# Patient Record
Sex: Female | Born: 1945 | Race: White | Hispanic: No
Health system: Southern US, Community
[De-identification: ages and names within clinical notes are randomized; demographics above are authoritative.]

## PROBLEM LIST (undated history)

## (undated) DIAGNOSIS — J939 Pneumothorax, unspecified: Secondary | ICD-10-CM

## (undated) DIAGNOSIS — R6520 Severe sepsis without septic shock: Secondary | ICD-10-CM

## (undated) DIAGNOSIS — C3431 Malignant neoplasm of lower lobe, right bronchus or lung: Secondary | ICD-10-CM

## (undated) DIAGNOSIS — J181 Lobar pneumonia, unspecified organism: Secondary | ICD-10-CM

## (undated) DIAGNOSIS — J9621 Acute and chronic respiratory failure with hypoxia: Secondary | ICD-10-CM

## (undated) DIAGNOSIS — A419 Sepsis, unspecified organism: Secondary | ICD-10-CM

---

## 2019-04-06 ENCOUNTER — Inpatient Hospital Stay
Admission: RE | Admit: 2019-04-06 | Discharge: 2019-05-25 | Disposition: E | Payer: Medicare Other | Source: Ambulatory Visit | Attending: Internal Medicine | Admitting: Internal Medicine

## 2019-04-06 ENCOUNTER — Other Ambulatory Visit (HOSPITAL_COMMUNITY): Payer: Medicare Other

## 2019-04-06 DIAGNOSIS — R0603 Acute respiratory distress: Secondary | ICD-10-CM

## 2019-04-06 DIAGNOSIS — L985 Mucinosis of the skin: Secondary | ICD-10-CM

## 2019-04-06 DIAGNOSIS — Z9289 Personal history of other medical treatment: Secondary | ICD-10-CM

## 2019-04-06 DIAGNOSIS — J939 Pneumothorax, unspecified: Secondary | ICD-10-CM | POA: Diagnosis present

## 2019-04-06 DIAGNOSIS — J9621 Acute and chronic respiratory failure with hypoxia: Secondary | ICD-10-CM | POA: Diagnosis present

## 2019-04-06 DIAGNOSIS — C3431 Malignant neoplasm of lower lobe, right bronchus or lung: Secondary | ICD-10-CM | POA: Diagnosis present

## 2019-04-06 DIAGNOSIS — A419 Sepsis, unspecified organism: Secondary | ICD-10-CM | POA: Diagnosis present

## 2019-04-06 DIAGNOSIS — R509 Fever, unspecified: Secondary | ICD-10-CM

## 2019-04-06 DIAGNOSIS — J111 Influenza due to unidentified influenza virus with other respiratory manifestations: Secondary | ICD-10-CM

## 2019-04-06 DIAGNOSIS — J90 Pleural effusion, not elsewhere classified: Secondary | ICD-10-CM

## 2019-04-06 DIAGNOSIS — J9690 Respiratory failure, unspecified, unspecified whether with hypoxia or hypercapnia: Secondary | ICD-10-CM

## 2019-04-06 DIAGNOSIS — J189 Pneumonia, unspecified organism: Secondary | ICD-10-CM

## 2019-04-06 DIAGNOSIS — J181 Lobar pneumonia, unspecified organism: Secondary | ICD-10-CM | POA: Diagnosis present

## 2019-04-06 DIAGNOSIS — R6520 Severe sepsis without septic shock: Secondary | ICD-10-CM | POA: Diagnosis present

## 2019-04-06 DIAGNOSIS — J811 Chronic pulmonary edema: Secondary | ICD-10-CM

## 2019-04-06 HISTORY — DX: Malignant neoplasm of lower lobe, right bronchus or lung: C34.31

## 2019-04-06 HISTORY — DX: Lobar pneumonia, unspecified organism: J18.1

## 2019-04-06 HISTORY — DX: Pneumothorax, unspecified: J93.9

## 2019-04-06 HISTORY — DX: Severe sepsis without septic shock: R65.20

## 2019-04-06 HISTORY — DX: Acute and chronic respiratory failure with hypoxia: J96.21

## 2019-04-06 HISTORY — DX: Sepsis, unspecified organism: A41.9

## 2019-04-06 LAB — T4, FREE: Free T4: 1.03 ng/dL (ref 0.82–1.77)

## 2019-04-06 LAB — VANCOMYCIN, TROUGH: Vancomycin Tr: 23 ug/mL (ref 15–20)

## 2019-04-07 LAB — HEMOGLOBIN A1C
Hgb A1c MFr Bld: 5.6 % (ref 4.8–5.6)
Mean Plasma Glucose: 114.02 mg/dL

## 2019-04-07 LAB — COMPREHENSIVE METABOLIC PANEL
ALT: 41 U/L (ref 0–44)
AST: 55 U/L — ABNORMAL HIGH (ref 15–41)
Albumin: 1.8 g/dL — ABNORMAL LOW (ref 3.5–5.0)
Alkaline Phosphatase: 140 U/L — ABNORMAL HIGH (ref 38–126)
Anion gap: 8 (ref 5–15)
BUN: 16 mg/dL (ref 8–23)
CO2: 27 mmol/L (ref 22–32)
Calcium: 8.7 mg/dL — ABNORMAL LOW (ref 8.9–10.3)
Chloride: 101 mmol/L (ref 98–111)
Creatinine, Ser: 0.53 mg/dL (ref 0.44–1.00)
GFR calc Af Amer: >60 mL/min (ref >60)
GFR calc non Af Amer: >60 mL/min (ref >60)
Glucose, Bld: 112 mg/dL — ABNORMAL HIGH (ref 70–99)
Potassium: 4.2 mmol/L (ref 3.5–5.1)
Sodium: 136 mmol/L (ref 135–145)
Total Bilirubin: 0.7 mg/dL (ref 0.3–1.2)
Total Protein: 5.7 g/dL — ABNORMAL LOW (ref 6.5–8.1)

## 2019-04-07 LAB — CBC WITH DIFFERENTIAL/PLATELET
Abs Immature Granulocytes: 0.25 10*3/uL — ABNORMAL HIGH (ref 0.00–0.07)
Basophils Absolute: 0.1 10*3/uL (ref 0.0–0.1)
Basophils Relative: 1 %
Eosinophils Absolute: 0.4 10*3/uL (ref 0.0–0.5)
Eosinophils Relative: 3 %
HCT: 28.9 % — ABNORMAL LOW (ref 36.0–46.0)
Hemoglobin: 9.1 g/dL — ABNORMAL LOW (ref 12.0–15.0)
Immature Granulocytes: 2 %
Lymphocytes Relative: 12 %
Lymphs Abs: 1.7 10*3/uL (ref 0.7–4.0)
MCH: 29 pg (ref 26.0–34.0)
MCHC: 31.5 g/dL (ref 30.0–36.0)
MCV: 92 fL (ref 80.0–100.0)
Monocytes Absolute: 1 10*3/uL (ref 0.1–1.0)
Monocytes Relative: 7 %
Neutro Abs: 10.6 10*3/uL — ABNORMAL HIGH (ref 1.7–7.7)
Neutrophils Relative %: 75 %
Platelets: 486 10*3/uL — ABNORMAL HIGH (ref 150–400)
RBC: 3.14 MIL/uL — ABNORMAL LOW (ref 3.87–5.11)
RDW: 15.7 % — ABNORMAL HIGH (ref 11.5–15.5)
WBC: 14 10*3/uL — ABNORMAL HIGH (ref 4.0–10.5)
nRBC: 0.1 % (ref 0.0–0.2)

## 2019-04-07 LAB — PROTIME-INR
INR: 1 (ref 0.8–1.2)
Prothrombin Time: 13.4 seconds (ref 11.4–15.2)

## 2019-04-07 LAB — MAGNESIUM: Magnesium: 2 mg/dL (ref 1.7–2.4)

## 2019-04-07 LAB — TSH: TSH: 1.995 u[IU]/mL (ref 0.350–4.500)

## 2019-04-07 LAB — PHOSPHORUS: Phosphorus: 2.9 mg/dL (ref 2.5–4.6)

## 2019-04-08 LAB — BASIC METABOLIC PANEL
Anion gap: 9 (ref 5–15)
BUN: 18 mg/dL (ref 8–23)
CO2: 26 mmol/L (ref 22–32)
Calcium: 8.4 mg/dL — ABNORMAL LOW (ref 8.9–10.3)
Chloride: 101 mmol/L (ref 98–111)
Creatinine, Ser: 0.7 mg/dL (ref 0.44–1.00)
GFR calc Af Amer: >60 mL/min (ref >60)
GFR calc non Af Amer: >60 mL/min (ref >60)
Glucose, Bld: 106 mg/dL — ABNORMAL HIGH (ref 70–99)
Potassium: 4.3 mmol/L (ref 3.5–5.1)
Sodium: 136 mmol/L (ref 135–145)

## 2019-04-08 LAB — CBC
HCT: 29 % — ABNORMAL LOW (ref 36.0–46.0)
Hemoglobin: 9.1 g/dL — ABNORMAL LOW (ref 12.0–15.0)
MCH: 29 pg (ref 26.0–34.0)
MCHC: 31.4 g/dL (ref 30.0–36.0)
MCV: 92.4 fL (ref 80.0–100.0)
Platelets: 443 10*3/uL — ABNORMAL HIGH (ref 150–400)
RBC: 3.14 MIL/uL — ABNORMAL LOW (ref 3.87–5.11)
RDW: 15.7 % — ABNORMAL HIGH (ref 11.5–15.5)
WBC: 14.5 10*3/uL — ABNORMAL HIGH (ref 4.0–10.5)
nRBC: 0 % (ref 0.0–0.2)

## 2019-04-08 LAB — MAGNESIUM: Magnesium: 2.1 mg/dL (ref 1.7–2.4)

## 2019-04-08 LAB — PHOSPHORUS: Phosphorus: 3.3 mg/dL (ref 2.5–4.6)

## 2019-04-09 LAB — URINALYSIS, ROUTINE W REFLEX MICROSCOPIC
Bilirubin Urine: NEGATIVE
Glucose, UA: NEGATIVE mg/dL
Ketones, ur: NEGATIVE mg/dL
Leukocytes,Ua: NEGATIVE
Nitrite: NEGATIVE
Protein, ur: 30 mg/dL — AB
RBC / HPF: >50 RBC/hpf — ABNORMAL HIGH (ref 0–5)
Specific Gravity, Urine: 1.019 (ref 1.005–1.030)
pH: 5 (ref 5.0–8.0)

## 2019-04-09 LAB — C DIFFICILE QUICK SCREEN W PCR REFLEX
C Diff antigen: NEGATIVE
C Diff interpretation: NOT DETECTED
C Diff toxin: NEGATIVE

## 2019-04-10 ENCOUNTER — Other Ambulatory Visit (HOSPITAL_COMMUNITY): Payer: Medicare Other

## 2019-04-10 LAB — URINE CULTURE: Culture: NO GROWTH

## 2019-04-10 LAB — BLOOD GAS, ARTERIAL
Acid-Base Excess: 6.8 mmol/L — ABNORMAL HIGH (ref 0.0–2.0)
Bicarbonate: 31.8 mmol/L — ABNORMAL HIGH (ref 20.0–28.0)
Delivery systems: POSITIVE
Expiratory PAP: 10
FIO2: 60
Inspiratory PAP: 18
Mode: POSITIVE
O2 Saturation: 95.6 %
Patient temperature: 98.6
pCO2 arterial: 54.9 mmHg — ABNORMAL HIGH (ref 32.0–48.0)
pH, Arterial: 7.381 (ref 7.350–7.450)
pO2, Arterial: 78.2 mmHg — ABNORMAL LOW (ref 83.0–108.0)

## 2019-04-11 ENCOUNTER — Other Ambulatory Visit (HOSPITAL_COMMUNITY): Payer: Medicare Other

## 2019-04-11 LAB — BASIC METABOLIC PANEL
Anion gap: 8 (ref 5–15)
BUN: 20 mg/dL (ref 8–23)
CO2: 34 mmol/L — ABNORMAL HIGH (ref 22–32)
Calcium: 9 mg/dL (ref 8.9–10.3)
Chloride: 95 mmol/L — ABNORMAL LOW (ref 98–111)
Creatinine, Ser: 0.48 mg/dL (ref 0.44–1.00)
GFR calc Af Amer: >60 mL/min (ref >60)
GFR calc non Af Amer: >60 mL/min (ref >60)
Glucose, Bld: 103 mg/dL — ABNORMAL HIGH (ref 70–99)
Potassium: 4.1 mmol/L (ref 3.5–5.1)
Sodium: 137 mmol/L (ref 135–145)

## 2019-04-11 LAB — BLOOD GAS, ARTERIAL
Acid-Base Excess: 11.6 mmol/L — ABNORMAL HIGH (ref 0.0–2.0)
Bicarbonate: 36.5 mmol/L — ABNORMAL HIGH (ref 20.0–28.0)
Delivery systems: POSITIVE
Expiratory PAP: 5
FIO2: 0.45
Inspiratory PAP: 18
O2 Saturation: 98.7 %
Patient temperature: 98.6
pCO2 arterial: 56.2 mmHg — ABNORMAL HIGH (ref 32.0–48.0)
pH, Arterial: 7.428 (ref 7.350–7.450)
pO2, Arterial: 127 mmHg — ABNORMAL HIGH (ref 83.0–108.0)

## 2019-04-11 LAB — CBC
HCT: 28.1 % — ABNORMAL LOW (ref 36.0–46.0)
Hemoglobin: 8.7 g/dL — ABNORMAL LOW (ref 12.0–15.0)
MCH: 29.3 pg (ref 26.0–34.0)
MCHC: 31 g/dL (ref 30.0–36.0)
MCV: 94.6 fL (ref 80.0–100.0)
Platelets: 373 10*3/uL (ref 150–400)
RBC: 2.97 MIL/uL — ABNORMAL LOW (ref 3.87–5.11)
RDW: 16 % — ABNORMAL HIGH (ref 11.5–15.5)
WBC: 14.9 10*3/uL — ABNORMAL HIGH (ref 4.0–10.5)
nRBC: 0 % (ref 0.0–0.2)

## 2019-04-11 LAB — MAGNESIUM: Magnesium: 2.1 mg/dL (ref 1.7–2.4)

## 2019-04-11 LAB — PHOSPHORUS: Phosphorus: 3.7 mg/dL (ref 2.5–4.6)

## 2019-04-14 ENCOUNTER — Other Ambulatory Visit (HOSPITAL_COMMUNITY): Payer: Medicare Other

## 2019-04-14 DIAGNOSIS — J9621 Acute and chronic respiratory failure with hypoxia: Secondary | ICD-10-CM

## 2019-04-14 DIAGNOSIS — A419 Sepsis, unspecified organism: Secondary | ICD-10-CM

## 2019-04-14 DIAGNOSIS — C3431 Malignant neoplasm of lower lobe, right bronchus or lung: Secondary | ICD-10-CM

## 2019-04-14 DIAGNOSIS — J939 Pneumothorax, unspecified: Secondary | ICD-10-CM | POA: Diagnosis not present

## 2019-04-14 DIAGNOSIS — J181 Lobar pneumonia, unspecified organism: Secondary | ICD-10-CM | POA: Diagnosis not present

## 2019-04-14 DIAGNOSIS — R6520 Severe sepsis without septic shock: Secondary | ICD-10-CM

## 2019-04-14 LAB — BASIC METABOLIC PANEL
Anion gap: 11 (ref 5–15)
BUN: 28 mg/dL — ABNORMAL HIGH (ref 8–23)
CO2: 33 mmol/L — ABNORMAL HIGH (ref 22–32)
Calcium: 9.2 mg/dL (ref 8.9–10.3)
Chloride: 94 mmol/L — ABNORMAL LOW (ref 98–111)
Creatinine, Ser: 0.41 mg/dL — ABNORMAL LOW (ref 0.44–1.00)
GFR calc Af Amer: >60 mL/min (ref >60)
GFR calc non Af Amer: >60 mL/min (ref >60)
Glucose, Bld: 127 mg/dL — ABNORMAL HIGH (ref 70–99)
Potassium: 5.1 mmol/L (ref 3.5–5.1)
Sodium: 138 mmol/L (ref 135–145)

## 2019-04-14 LAB — CBC
HCT: 28 % — ABNORMAL LOW (ref 36.0–46.0)
Hemoglobin: 8.6 g/dL — ABNORMAL LOW (ref 12.0–15.0)
MCH: 28.9 pg (ref 26.0–34.0)
MCHC: 30.7 g/dL (ref 30.0–36.0)
MCV: 94 fL (ref 80.0–100.0)
Platelets: 411 10*3/uL — ABNORMAL HIGH (ref 150–400)
RBC: 2.98 MIL/uL — ABNORMAL LOW (ref 3.87–5.11)
RDW: 15.7 % — ABNORMAL HIGH (ref 11.5–15.5)
WBC: 11.5 10*3/uL — ABNORMAL HIGH (ref 4.0–10.5)
nRBC: 0 % (ref 0.0–0.2)

## 2019-04-14 MED ORDER — FLUCONAZOLE IN SODIUM CHLORIDE 200-0.9 MG/100ML-% IV SOLN
200.00 | INTRAVENOUS | Status: DC
Start: 2019-04-06 — End: 2019-04-14

## 2019-04-14 MED ORDER — PSYLLIUM 51.7 % PO PACK
1.00 | PACK | ORAL | Status: DC
Start: 2019-04-07 — End: 2019-04-14

## 2019-04-14 MED ORDER — INSULIN LISPRO 100 UNIT/ML ~~LOC~~ SOLN
0.00 | SUBCUTANEOUS | Status: DC
Start: 2019-04-06 — End: 2019-04-14

## 2019-04-14 MED ORDER — IOHEXOL 350 MG/ML SOLN
100.0000 mL | Freq: Once | INTRAVENOUS | Status: AC | PRN
  Administered 2019-04-14: 100 mL via INTRAVENOUS

## 2019-04-14 MED ORDER — SODIUM BICARBONATE 650 MG PO TABS
650.00 | ORAL_TABLET | ORAL | Status: DC
Start: ? — End: 2019-04-14

## 2019-04-14 MED ORDER — PANTOPRAZOLE SODIUM 40 MG IV SOLR
40.00 | INTRAVENOUS | Status: DC
Start: 2019-04-06 — End: 2019-04-14

## 2019-04-14 MED ORDER — ACETAMINOPHEN 500 MG PO TABS
1000.00 | ORAL_TABLET | ORAL | Status: DC
Start: 2019-04-06 — End: 2019-04-14

## 2019-04-14 MED ORDER — GENERIC EXTERNAL MEDICATION
1.00 | Status: DC
Start: 2019-04-07 — End: 2019-04-14

## 2019-04-14 MED ORDER — GENERIC EXTERNAL MEDICATION
Status: DC
Start: 2019-04-07 — End: 2019-04-14

## 2019-04-14 MED ORDER — MIRTAZAPINE 15 MG PO TABS
15.00 | ORAL_TABLET | ORAL | Status: DC
Start: ? — End: 2019-04-14

## 2019-04-14 MED ORDER — FLUOXETINE HCL 20 MG PO CAPS
40.00 | ORAL_CAPSULE | ORAL | Status: DC
Start: 2019-04-07 — End: 2019-04-14

## 2019-04-14 MED ORDER — LABETALOL HCL 5 MG/ML IV SOLN
10.00 | INTRAVENOUS | Status: DC
Start: ? — End: 2019-04-14

## 2019-04-14 MED ORDER — HEPARIN SODIUM (PORCINE) 5000 UNIT/ML IJ SOLN
5000.00 | INTRAMUSCULAR | Status: DC
Start: 2019-04-06 — End: 2019-04-14

## 2019-04-14 MED ORDER — ONDANSETRON HCL 4 MG/2ML IJ SOLN
4.00 | INTRAMUSCULAR | Status: DC
Start: ? — End: 2019-04-14

## 2019-04-14 MED ORDER — GENERIC EXTERNAL MEDICATION
5.00 | Status: DC
Start: ? — End: 2019-04-14

## 2019-04-14 MED ORDER — GENERIC EXTERNAL MEDICATION
Status: DC
Start: ? — End: 2019-04-14

## 2019-04-14 MED ORDER — DEXTROSE 10 % IV SOLN
125.00 | INTRAVENOUS | Status: DC
Start: ? — End: 2019-04-14

## 2019-04-14 MED ORDER — BISACODYL 10 MG RE SUPP
10.00 | RECTAL | Status: DC
Start: ? — End: 2019-04-14

## 2019-04-14 MED ORDER — GLUCAGON HCL RDNA (DIAGNOSTIC) 1 MG IJ SOLR
1.00 | INTRAMUSCULAR | Status: DC
Start: ? — End: 2019-04-14

## 2019-04-14 MED ORDER — HYDRALAZINE HCL 20 MG/ML IJ SOLN
10.00 | INTRAMUSCULAR | Status: DC
Start: ? — End: 2019-04-14

## 2019-04-14 MED ORDER — FLUTICASONE FUROATE-VILANTEROL 100-25 MCG/INH IN AEPB
1.00 | INHALATION_SPRAY | RESPIRATORY_TRACT | Status: DC
Start: 2019-04-07 — End: 2019-04-14

## 2019-04-14 MED ORDER — PANCRELIPASE (LIP-PROT-AMYL) 5000-24000 UNITS PO CPEP
5000.00 | ORAL_CAPSULE | ORAL | Status: DC
Start: ? — End: 2019-04-14

## 2019-04-14 MED ORDER — GENERIC EXTERNAL MEDICATION
2.00 | Status: DC
Start: 2019-04-07 — End: 2019-04-14

## 2019-04-14 MED ORDER — LIDOCAINE 4 % EX PTCH
2.00 | MEDICATED_PATCH | CUTANEOUS | Status: DC
Start: 2019-04-07 — End: 2019-04-14

## 2019-04-14 MED ORDER — TRAMADOL HCL 50 MG PO TABS
50.00 | ORAL_TABLET | ORAL | Status: DC
Start: ? — End: 2019-04-14

## 2019-04-14 MED ORDER — GLUCOSE 40 % PO GEL
15.00 | ORAL | Status: DC
Start: ? — End: 2019-04-14

## 2019-04-14 NOTE — Consult Note (Signed)
Pulmonary [place]  Date of Service: 04/14/2019  PULMONARY CRITICAL CARE Amanda Duran  HQP:[id number]  DOB: Oct 05, 1946   DOA: 04/04/2019  Referring Physician: Merton Border, MD  HPI: Amanda Duran is a 73 y.o. female seen for follow up of Acute on Chronic Respiratory Failure.  Patient apparently underwent a cancer screening test by her pulmonologist and a right lower lobe nodule was found.  PET scan was done which showed hypermetabolic activity and also some lymphadenopathy that was positive.  CT surgery was evaluated and patient underwent a endobronchial ultrasound.  Patient was brought to the surgical team patient did undergo a resection of the right lower lobe along with mediastinal lymph node dissection on the right chest tube was placed at the time.  Postoperatively patient was extubated and transferred to the floor.  Patient subsequently had a small air leak noted which was noted on the chest tube.  Chest x-ray was done with the tube clamped and patient did not have improvement with the persistence of a small air leak.  She continued to have a small tiny pneumothorax.  She also had complications related to abdominal issues patient underwent an EGD which shown to have a duodenal ulcer which was concerning for perforation.  Subsequently patient went to the OR for an exploratory lap and underwent repair of the duodenal ulcer.  Patient did require BiPAP support and subsequently patient was made DNR/DNI for no intubation.  Transferred to our facility for further management at the time that she is seen and evaluated she appears to be comfortable in no distress chest x-ray shows some changes related to the right lower lobe.  Review of Systems:  ROS performed and is unremarkable other than noted above.  Past medical history non-small cell cancer of the lung COPD Depression Colon polyps  Past surgical history: Status post lung  resection Duodenal ulcer repaired Right lower lobe  Social history: No current smoking alcohol or drug abuse  Family History: Non-Contributory to the present illness  Allergies sulfa and amoxicillin  Medications: Reviewed on Rounds  Physical Exam:  Vitals: Temperature 98.6 pulse 70 respiratory rate 18 blood pressure 110/70 saturations 98%  Ventilator Settings off the ventilator  . General: Comfortable at this time . Eyes: Grossly normal lids, irises & conjunctiva . ENT: grossly tongue is normal . Neck: no obvious mass . Cardiovascular: S1-S2 normal no gallop or rub is noted at this time . Respiratory: Diminished breath sounds right lower lobe . Abdomen: Soft and nontender . Skin: no rash seen on limited exam . Musculoskeletal: not rigid . Psychiatric:unable to assess . Neurologic: no seizure no involuntary movements         Labs on Admission:  Basic Metabolic Panel: Recent Labs  Lab 04/08/19 0616 04/11/19 0618 04/14/19 0506  NA 136 137 138  K 4.3 4.1 5.1  CL 101 95* 94*  CO2 26 34* 33*  GLUCOSE 106* 103* 127*  BUN 18 20 28*  CREATININE 0.70 0.48 0.41*  CALCIUM 8.4* 9.0 9.2  MG 2.1 2.1  --   PHOS 3.3 3.7  --     Recent Labs  Lab 04/10/19 0540 04/11/19 0430  PHART 7.381 7.428  PCO2ART 54.9* 56.2*  PO2ART 78.2* 127*  HCO3 31.8* 36.5*  O2SAT 95.6 98.7    Liver Function Tests: No results for input(s): AST, ALT, ALKPHOS, BILITOT, PROT, ALBUMIN in the last 168 hours. No results for input(s): LIPASE, AMYLASE in  the last 168 hours. No results for input(s): AMMONIA in the last 168 hours.  CBC: Recent Labs  Lab 04/08/19 1146 04/11/19 0618 04/14/19 0506  WBC 14.5* 14.9* 11.5*  HGB 9.1* 8.7* 8.6*  HCT 29.0* 28.1* 28.0*  MCV 92.4 94.6 94.0  PLT 443* 373 411*    Cardiac Enzymes: No results for input(s): CKTOTAL, CKMB, CKMBINDEX, TROPONINI in the last 168 hours.  BNP (last 3 results) No results for input(s): BNP in the last 8760 hours.  ProBNP  (last 3 results) No results for input(s): PROBNP in the last 8760 hours.   Radiological Exams on Admission: Dg Chest Port 1 View  Result Date: 04/14/2019 CLINICAL DATA:  Pleural effusion.  Flu. EXAM: PORTABLE CHEST 1 VIEW COMPARISON:  Three days ago FINDINGS: Feeding tube at least reaches the stomach. Unchanged patchy reticular opacities with volume loss on the right. Stable heart size that is normal for technique. The PICC has been removed. There is gas and hematoma at the right upper quadrant by chest CT 04/01/2019. Small right pleural effusion. IMPRESSION: 1. Stable airspace disease. 2. Unchanged gas and fluid collection at the right upper quadrant where there is surgical drain. Electronically Signed   By: Monte Fantasia M.D.   On: 04/14/2019 06:44   Dg Chest Port 1 View  Result Date: 04/11/2019 CLINICAL DATA:  Pleural effusion EXAM: PORTABLE CHEST 1 VIEW COMPARISON:  Yesterday FINDINGS: Feeding tube at least reaches the stomach. Right-sided PICC with tip at the upper cavoatrial junction. Pulmonary opacity on the right more than left. Continued elevation of the right diaphragm where there is small volume pleural fluid. No pneumothorax. Stable heart size. IMPRESSION: Stable hardware positioning and airspace disease. Electronically Signed   By: Monte Fantasia M.D.   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 Acute on chronic respiratory failure with hypoxia reportedly patient been having increased oxygen requirements looking at the data from her previous hospitalization she was apparently on 4 L oxygen and this requirement has been going up.  I did look at the chest x-ray shows some changes postsurgically on the right lower lobe.  Concern would be postoperatively for pulmonary embolism spoke with primary  care team will recommend getting a CT angiogram for assessment at this time. 2. Non-small cell cancer of the lung status post right lower lobe removal we will continue to monitor patient does have some residual changes noted at this time. 3. Severe sepsis with pneumonia treated we will monitor radiologically follow-up. 4. History of pneumothorax again follow radiologically we will continue with supportive care most recent x-ray was fine show small effusion  I have personally seen and evaluated the patient, evaluated laboratory and imaging results, formulated the assessment and plan and placed orders. The Patient requires high complexity decision making for assessment and support.  Case was discussed on Rounds with the Respiratory Therapy Staff Time Spent 15minutes  Allyne Gee, MD [facility] Pulmonary Critical Care Medicine Sleep Medicine

## 2019-04-15 ENCOUNTER — Other Ambulatory Visit (HOSPITAL_COMMUNITY): Payer: Medicare Other

## 2019-04-15 LAB — BLOOD GAS, ARTERIAL
Acid-Base Excess: 15 mmol/L — ABNORMAL HIGH (ref 0.0–2.0)
Acid-Base Excess: 4.7 mmol/L — ABNORMAL HIGH (ref 0.0–2.0)
Acid-Base Excess: 9.4 mmol/L — ABNORMAL HIGH (ref 0.0–2.0)
Bicarbonate: 42 mmol/L — ABNORMAL HIGH (ref 20.0–28.0)
Bicarbonate: 31.7 mmol/L — ABNORMAL HIGH (ref 20.0–28.0)
Bicarbonate: 35 mmol/L — ABNORMAL HIGH (ref 20.0–28.0)
Delivery systems: POSITIVE
Expiratory PAP: 10
FIO2: 100
FIO2: 80
FIO2: 80
Inspiratory PAP: 18
MECHVT: 330 mL
MECHVT: 380 mL
O2 Saturation: 99.5 %
O2 Saturation: 98.8 %
O2 Saturation: 99.4 %
PEEP: 5 cmH2O
PEEP: 5 cmH2O
Patient temperature: 98.6
Patient temperature: 98.6
Patient temperature: 98.6
RATE: 20 resp/min
RATE: 24 resp/min
pCO2 arterial: 89.6 mmHg (ref 32.0–48.0)
pCO2 arterial: 77 mmHg (ref 32.0–48.0)
pCO2 arterial: 63.8 mmHg — ABNORMAL HIGH (ref 32.0–48.0)
pH, Arterial: 7.293 — ABNORMAL LOW (ref 7.350–7.450)
pH, Arterial: 7.238 — ABNORMAL LOW (ref 7.350–7.450)
pH, Arterial: 7.358 (ref 7.350–7.450)
pO2, Arterial: 326 mmHg — ABNORMAL HIGH (ref 83.0–108.0)
pO2, Arterial: 190 mmHg — ABNORMAL HIGH (ref 83.0–108.0)
pO2, Arterial: 209 mmHg — ABNORMAL HIGH (ref 83.0–108.0)

## 2019-04-15 LAB — BASIC METABOLIC PANEL
Anion gap: 17 — ABNORMAL HIGH (ref 5–15)
BUN: 34 mg/dL — ABNORMAL HIGH (ref 8–23)
CO2: 30 mmol/L (ref 22–32)
Calcium: 9 mg/dL (ref 8.9–10.3)
Chloride: 92 mmol/L — ABNORMAL LOW (ref 98–111)
Creatinine, Ser: 1 mg/dL (ref 0.44–1.00)
GFR calc Af Amer: >60 mL/min (ref >60)
GFR calc non Af Amer: 56 mL/min — ABNORMAL LOW (ref >60)
Glucose, Bld: 142 mg/dL — ABNORMAL HIGH (ref 70–99)
Potassium: 5.2 mmol/L — ABNORMAL HIGH (ref 3.5–5.1)
Sodium: 139 mmol/L (ref 135–145)

## 2019-04-15 LAB — CBC WITH DIFFERENTIAL/PLATELET
Abs Immature Granulocytes: 0.84 10*3/uL — ABNORMAL HIGH (ref 0.00–0.07)
Basophils Absolute: 0.1 10*3/uL (ref 0.0–0.1)
Basophils Relative: 0 %
Eosinophils Absolute: 0.1 10*3/uL (ref 0.0–0.5)
Eosinophils Relative: 0 %
HCT: 29.8 % — ABNORMAL LOW (ref 36.0–46.0)
Hemoglobin: 9 g/dL — ABNORMAL LOW (ref 12.0–15.0)
Immature Granulocytes: 4 %
Lymphocytes Relative: 9 %
Lymphs Abs: 2.2 10*3/uL (ref 0.7–4.0)
MCH: 29 pg (ref 26.0–34.0)
MCHC: 30.2 g/dL (ref 30.0–36.0)
MCV: 96.1 fL (ref 80.0–100.0)
Monocytes Absolute: 2.1 10*3/uL — ABNORMAL HIGH (ref 0.1–1.0)
Monocytes Relative: 9 %
Neutro Abs: 18.4 10*3/uL — ABNORMAL HIGH (ref 1.7–7.7)
Neutrophils Relative %: 78 %
Platelets: 494 10*3/uL — ABNORMAL HIGH (ref 150–400)
RBC: 3.1 MIL/uL — ABNORMAL LOW (ref 3.87–5.11)
RDW: 15.9 % — ABNORMAL HIGH (ref 11.5–15.5)
WBC Morphology: INCREASED
WBC: 23.7 10*3/uL — ABNORMAL HIGH (ref 4.0–10.5)
nRBC: 0.2 % (ref 0.0–0.2)

## 2019-04-15 LAB — PROTIME-INR
INR: 1.1 (ref 0.8–1.2)
Prothrombin Time: 14.4 seconds (ref 11.4–15.2)

## 2019-04-15 LAB — POTASSIUM: Potassium: 4.9 mmol/L (ref 3.5–5.1)

## 2019-04-16 ENCOUNTER — Other Ambulatory Visit (HOSPITAL_COMMUNITY): Payer: Medicare Other

## 2019-04-16 ENCOUNTER — Encounter: Payer: Self-pay | Admitting: Internal Medicine

## 2019-04-16 DIAGNOSIS — J9621 Acute and chronic respiratory failure with hypoxia: Secondary | ICD-10-CM | POA: Diagnosis present

## 2019-04-16 DIAGNOSIS — A419 Sepsis, unspecified organism: Secondary | ICD-10-CM | POA: Diagnosis present

## 2019-04-16 DIAGNOSIS — R6520 Severe sepsis without septic shock: Secondary | ICD-10-CM | POA: Diagnosis present

## 2019-04-16 DIAGNOSIS — C3431 Malignant neoplasm of lower lobe, right bronchus or lung: Secondary | ICD-10-CM | POA: Diagnosis present

## 2019-04-16 DIAGNOSIS — J181 Lobar pneumonia, unspecified organism: Secondary | ICD-10-CM | POA: Diagnosis present

## 2019-04-16 DIAGNOSIS — J939 Pneumothorax, unspecified: Secondary | ICD-10-CM | POA: Diagnosis present

## 2019-04-16 LAB — CBC
HCT: 27.9 % — ABNORMAL LOW (ref 36.0–46.0)
Hemoglobin: 8.7 g/dL — ABNORMAL LOW (ref 12.0–15.0)
MCH: 28.6 pg (ref 26.0–34.0)
MCHC: 31.2 g/dL (ref 30.0–36.0)
MCV: 91.8 fL (ref 80.0–100.0)
Platelets: 536 10*3/uL — ABNORMAL HIGH (ref 150–400)
RBC: 3.04 MIL/uL — ABNORMAL LOW (ref 3.87–5.11)
RDW: 15.9 % — ABNORMAL HIGH (ref 11.5–15.5)
WBC: 24.4 10*3/uL — ABNORMAL HIGH (ref 4.0–10.5)
nRBC: 0.3 % — ABNORMAL HIGH (ref 0.0–0.2)

## 2019-04-16 LAB — URINALYSIS, ROUTINE W REFLEX MICROSCOPIC
Bilirubin Urine: NEGATIVE
Glucose, UA: NEGATIVE mg/dL
Ketones, ur: NEGATIVE mg/dL
Leukocytes,Ua: NEGATIVE
Nitrite: NEGATIVE
Protein, ur: 100 mg/dL — AB
Specific Gravity, Urine: 1.019 (ref 1.005–1.030)
pH: 5 (ref 5.0–8.0)

## 2019-04-16 LAB — LACTIC ACID, PLASMA
Lactic Acid, Venous: 1.2 mmol/L (ref 0.5–1.9)
Lactic Acid, Venous: 1 mmol/L (ref 0.5–1.9)

## 2019-04-16 LAB — CULTURE, RESPIRATORY W GRAM STAIN: Culture: NORMAL

## 2019-04-16 MED FILL — Medication: Qty: 1 | Status: AC

## 2019-04-17 LAB — COMPREHENSIVE METABOLIC PANEL
ALT: 61 U/L — ABNORMAL HIGH (ref 0–44)
AST: 115 U/L — ABNORMAL HIGH (ref 15–41)
Albumin: 1.4 g/dL — ABNORMAL LOW (ref 3.5–5.0)
Alkaline Phosphatase: 101 U/L (ref 38–126)
Anion gap: 15 (ref 5–15)
BUN: 65 mg/dL — ABNORMAL HIGH (ref 8–23)
CO2: 32 mmol/L (ref 22–32)
Calcium: 8.4 mg/dL — ABNORMAL LOW (ref 8.9–10.3)
Chloride: 94 mmol/L — ABNORMAL LOW (ref 98–111)
Creatinine, Ser: 1.12 mg/dL — ABNORMAL HIGH (ref 0.44–1.00)
GFR calc Af Amer: 56 mL/min — ABNORMAL LOW (ref >60)
GFR calc non Af Amer: 49 mL/min — ABNORMAL LOW (ref >60)
Glucose, Bld: 99 mg/dL (ref 70–99)
Potassium: 5 mmol/L (ref 3.5–5.1)
Sodium: 141 mmol/L (ref 135–145)
Total Bilirubin: 0.5 mg/dL (ref 0.3–1.2)
Total Protein: 5.3 g/dL — ABNORMAL LOW (ref 6.5–8.1)

## 2019-04-17 LAB — CBC
HCT: 27.3 % — ABNORMAL LOW (ref 36.0–46.0)
Hemoglobin: 8.5 g/dL — ABNORMAL LOW (ref 12.0–15.0)
MCH: 28.8 pg (ref 26.0–34.0)
MCHC: 31.1 g/dL (ref 30.0–36.0)
MCV: 92.5 fL (ref 80.0–100.0)
Platelets: 340 10*3/uL (ref 150–400)
RBC: 2.95 MIL/uL — ABNORMAL LOW (ref 3.87–5.11)
RDW: 16 % — ABNORMAL HIGH (ref 11.5–15.5)
WBC: 17.3 10*3/uL — ABNORMAL HIGH (ref 4.0–10.5)
nRBC: 0.2 % (ref 0.0–0.2)

## 2019-04-17 LAB — URINE CULTURE: Culture: NO GROWTH

## 2019-04-17 LAB — MAGNESIUM: Magnesium: 2.6 mg/dL — ABNORMAL HIGH (ref 1.7–2.4)

## 2019-04-18 ENCOUNTER — Other Ambulatory Visit (HOSPITAL_COMMUNITY): Payer: Medicare Other

## 2019-04-18 LAB — CBC
HCT: 25.3 % — ABNORMAL LOW (ref 36.0–46.0)
Hemoglobin: 7.7 g/dL — ABNORMAL LOW (ref 12.0–15.0)
MCH: 28.4 pg (ref 26.0–34.0)
MCHC: 30.4 g/dL (ref 30.0–36.0)
MCV: 93.4 fL (ref 80.0–100.0)
Platelets: 355 10*3/uL (ref 150–400)
RBC: 2.71 MIL/uL — ABNORMAL LOW (ref 3.87–5.11)
RDW: 16.1 % — ABNORMAL HIGH (ref 11.5–15.5)
WBC: 16.3 10*3/uL — ABNORMAL HIGH (ref 4.0–10.5)
nRBC: 0.1 % (ref 0.0–0.2)

## 2019-04-18 LAB — BASIC METABOLIC PANEL
Anion gap: 12 (ref 5–15)
BUN: 49 mg/dL — ABNORMAL HIGH (ref 8–23)
CO2: 34 mmol/L — ABNORMAL HIGH (ref 22–32)
Calcium: 9.1 mg/dL (ref 8.9–10.3)
Chloride: 98 mmol/L (ref 98–111)
Creatinine, Ser: 0.87 mg/dL (ref 0.44–1.00)
GFR calc Af Amer: >60 mL/min (ref >60)
GFR calc non Af Amer: >60 mL/min (ref >60)
Glucose, Bld: 118 mg/dL — ABNORMAL HIGH (ref 70–99)
Potassium: 3.1 mmol/L — ABNORMAL LOW (ref 3.5–5.1)
Sodium: 144 mmol/L (ref 135–145)

## 2019-04-18 LAB — MAGNESIUM: Magnesium: 2.3 mg/dL (ref 1.7–2.4)

## 2019-04-19 LAB — RENAL FUNCTION PANEL
Albumin: 1.6 g/dL — ABNORMAL LOW (ref 3.5–5.0)
Anion gap: 9 (ref 5–15)
BUN: 59 mg/dL — ABNORMAL HIGH (ref 8–23)
CO2: 34 mmol/L — ABNORMAL HIGH (ref 22–32)
Calcium: 9.6 mg/dL (ref 8.9–10.3)
Chloride: 102 mmol/L (ref 98–111)
Creatinine, Ser: 0.91 mg/dL (ref 0.44–1.00)
GFR calc Af Amer: >60 mL/min (ref >60)
GFR calc non Af Amer: >60 mL/min (ref >60)
Glucose, Bld: 152 mg/dL — ABNORMAL HIGH (ref 70–99)
Phosphorus: 3.5 mg/dL (ref 2.5–4.6)
Potassium: 3.9 mmol/L (ref 3.5–5.1)
Sodium: 145 mmol/L (ref 135–145)

## 2019-04-19 LAB — MAGNESIUM: Magnesium: 2.5 mg/dL — ABNORMAL HIGH (ref 1.7–2.4)

## 2019-04-19 LAB — CBC
HCT: 27.1 % — ABNORMAL LOW (ref 36.0–46.0)
Hemoglobin: 8.1 g/dL — ABNORMAL LOW (ref 12.0–15.0)
MCH: 28.3 pg (ref 26.0–34.0)
MCHC: 29.9 g/dL — ABNORMAL LOW (ref 30.0–36.0)
MCV: 94.8 fL (ref 80.0–100.0)
Platelets: 336 10*3/uL (ref 150–400)
RBC: 2.86 MIL/uL — ABNORMAL LOW (ref 3.87–5.11)
RDW: 16.1 % — ABNORMAL HIGH (ref 11.5–15.5)
WBC: 15.3 10*3/uL — ABNORMAL HIGH (ref 4.0–10.5)
nRBC: 0.3 % — ABNORMAL HIGH (ref 0.0–0.2)

## 2019-04-19 LAB — CULTURE, RESPIRATORY W GRAM STAIN

## 2019-04-19 LAB — VANCOMYCIN, TROUGH: Vancomycin Tr: 15 ug/mL (ref 15–20)

## 2019-04-20 DIAGNOSIS — J181 Lobar pneumonia, unspecified organism: Secondary | ICD-10-CM | POA: Diagnosis not present

## 2019-04-20 DIAGNOSIS — J939 Pneumothorax, unspecified: Secondary | ICD-10-CM | POA: Diagnosis not present

## 2019-04-20 DIAGNOSIS — J9621 Acute and chronic respiratory failure with hypoxia: Secondary | ICD-10-CM | POA: Diagnosis not present

## 2019-04-20 DIAGNOSIS — C3431 Malignant neoplasm of lower lobe, right bronchus or lung: Secondary | ICD-10-CM | POA: Diagnosis not present

## 2019-04-20 LAB — BLOOD GAS, ARTERIAL
Acid-Base Excess: 15.7 mmol/L — ABNORMAL HIGH (ref 0.0–2.0)
Bicarbonate: 41.2 mmol/L — ABNORMAL HIGH (ref 20.0–28.0)
FIO2: 35
MECHVT: 420 mL
O2 Saturation: 92.5 %
PEEP: 5 cmH2O
Patient temperature: 98.6
RATE: 25 resp/min
pCO2 arterial: 64.8 mmHg — ABNORMAL HIGH (ref 32.0–48.0)
pH, Arterial: 7.419 (ref 7.350–7.450)
pO2, Arterial: 63.6 mmHg — ABNORMAL LOW (ref 83.0–108.0)

## 2019-04-20 MED FILL — Medication: Qty: 1 | Status: AC

## 2019-04-20 NOTE — Progress Notes (Signed)
Pulmonary Critical Care Medicine [place]   PULMONARY CRITICAL CARE SERVICE  PROGRESS NOTE  Date of Service: 04/20/2019  [name]  DXA:[id number]  DOB: 1945/12/16   DOA: 04/13/2019  Referring Physician: Merton Border, MD  HPI: Amanda Duran is a 73 y.o. female seen for follow up of Acute on Chronic Respiratory Failure.  Patient is on the ventilator with endotracheal tube is in place.  Seems to be comfortable right now without distress  Medications: Reviewed on Rounds  Physical Exam:  Vitals: Temperature 97.6 pulse 99 respiratory 25 blood pressure is 164/67 saturations 95%  Ventilator Settings mode ventilation pressure support saturations 99%  . General: Comfortable at this time . Eyes: Grossly normal lids, irises & conjunctiva . ENT: grossly tongue is normal . Neck: no obvious mass . Cardiovascular: S1 S2 normal no gallop . Respiratory: No rhonchi no rales are noted . Abdomen: soft . Skin: no rash seen on limited exam . Musculoskeletal: not rigid . Psychiatric:unable to assess . Neurologic: no seizure no involuntary movements         Lab Data:   Basic Metabolic Panel: Recent Labs  Lab 04/14/19 0506 04/15/19 1610 04/15/19 2012 04/17/19 0554 04/18/19 0607 04/19/19 0605  NA 138  --  139 141 144 145  K 5.1 4.9 5.2* 5.0 3.1* 3.9  CL 94*  --  92* 94* 98 102  CO2 33*  --  30 32 34* 34*  GLUCOSE 127*  --  142* 99 118* 152*  BUN 28*  --  34* 65* 49* 59*  CREATININE 0.41*  --  1.00 1.12* 0.87 0.91  CALCIUM 9.2  --  9.0 8.4* 9.1 9.6  MG  --   --   --  2.6* 2.3 2.5*  PHOS  --   --   --   --   --  3.5    ABG: Recent Labs  Lab 04/15/19 1535 04/15/19 2010 04/15/19 2118  PHART 7.293* 7.238* 7.358  PCO2ART 89.6* 77.0* 63.8*  PO2ART 326* 190* 209*  HCO3 42.0* 31.7* 35.0*  O2SAT 99.5 98.8 99.4    Liver Function Tests: Recent Labs  Lab 04/17/19 0554 04/19/19 0605  AST 115*  --   ALT 61*  --   ALKPHOS 101  --   BILITOT 0.5  --   PROT  5.3*  --   ALBUMIN 1.4* 1.6*   No results for input(s): LIPASE, AMYLASE in the last 168 hours. No results for input(s): AMMONIA in the last 168 hours.  CBC: Recent Labs  Lab 04/15/19 2012 04/16/19 0508 04/17/19 1026 04/18/19 0607 04/19/19 0605  WBC 23.7* 24.4* 17.3* 16.3* 15.3*  NEUTROABS 18.4*  --   --   --   --   HGB 9.0* 8.7* 8.5* 7.7* 8.1*  HCT 29.8* 27.9* 27.3* 25.3* 27.1*  MCV 96.1 91.8 92.5 93.4 94.8  PLT 494* 536* 340 355 336    Cardiac Enzymes: No results for input(s): CKTOTAL, CKMB, CKMBINDEX, TROPONINI in the last 168 hours.  BNP (last 3 results) No results for input(s): BNP in the last 8760 hours.  ProBNP (last 3 results) No results for input(s): PROBNP in the last 8760 hours.  Radiological Exams: No results found.  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 Acute on chronic respiratory failure with  hypoxia patient is weaning on pressure support which will be continued.  Respiratory therapy tells me that he has been having some difficulty going to full protocol.  May be possible that patient might need tracheostomy will continue to follow 2. Cancer of the left lower lobe We will continue with supportive care 3. Severe sepsis hemodynamically stable continue to monitor. 4. Lobar pneumonia treated follow-up radiologically 5. Pneumothorax stable at this time we will continue with present management   I have personally seen and evaluated the patient, evaluated laboratory and imaging results, formulated the assessment and plan and placed orders. The Patient requires high complexity decision making for assessment and support.  Case was discussed on Rounds with the Respiratory Therapy Staff  Allyne Gee, MD [facility] Pulmonary Critical Care Medicine Sleep Medicine

## 2019-04-20 NOTE — Progress Notes (Addendum)
Pulmonary Critical Care Medicine [place]   PULMONARY CRITICAL CARE SERVICE  PROGRESS NOTE  Date of Service: 04/20/2019  TALAYSIA PINHEIRO  HQI:[id number]  DOB: 12-14-1945   DOA: 04/02/2019  Referring Physician: Merton Border, MD  HPI: Amanda Duran is a 73 y.o. female seen for follow up of Acute on Chronic Respiratory Failure.  Patient currently has a 12-hour goal on pressure support 12/5 with an FiO2 of 35%.  Satting with no distress at this time.  Medications: Reviewed on Rounds  Physical Exam:  Vitals: Pulse 72 respirations 18 BP 118/68 O2 sat 99% temp 98 point  Ventilator Settings pressure support 12/5 FiO2 35%  . General: Comfortable at this time . Eyes: Grossly normal lids, irises & conjunctiva . ENT: grossly tongue is normal . Neck: no obvious mass . Cardiovascular: S1 S2 normal no gallop . Respiratory: Coarse breath sounds . Abdomen: soft . Skin: no rash seen on limited exam . Musculoskeletal: not rigid . Psychiatric:unable to assess . Neurologic: no seizure no involuntary movements         Lab Data:   Basic Metabolic Panel: Recent Labs  Lab 04/14/19 0506 04/15/19 1610 04/15/19 2012 04/17/19 0554 04/18/19 0607 04/19/19 0605  NA 138  --  139 141 144 145  K 5.1 4.9 5.2* 5.0 3.1* 3.9  CL 94*  --  92* 94* 98 102  CO2 33*  --  30 32 34* 34*  GLUCOSE 127*  --  142* 99 118* 152*  BUN 28*  --  34* 65* 49* 59*  CREATININE 0.41*  --  1.00 1.12* 0.87 0.91  CALCIUM 9.2  --  9.0 8.4* 9.1 9.6  MG  --   --   --  2.6* 2.3 2.5*  PHOS  --   --   --   --   --  3.5    ABG: Recent Labs  Lab 04/15/19 1535 04/15/19 2010 04/15/19 2118  PHART 7.293* 7.238* 7.358  PCO2ART 89.6* 77.0* 63.8*  PO2ART 326* 190* 209*  HCO3 42.0* 31.7* 35.0*  O2SAT 99.5 98.8 99.4    Liver Function Tests: Recent Labs  Lab 04/17/19 0554 04/19/19 0605  AST 115*  --   ALT 61*  --   ALKPHOS 101  --   BILITOT 0.5  --   PROT 5.3*  --   ALBUMIN 1.4* 1.6*   No results  for input(s): LIPASE, AMYLASE in the last 168 hours. No results for input(s): AMMONIA in the last 168 hours.  CBC: Recent Labs  Lab 04/15/19 2012 04/16/19 0508 04/17/19 1026 04/18/19 0607 04/19/19 0605  WBC 23.7* 24.4* 17.3* 16.3* 15.3*  NEUTROABS 18.4*  --   --   --   --   HGB 9.0* 8.7* 8.5* 7.7* 8.1*  HCT 29.8* 27.9* 27.3* 25.3* 27.1*  MCV 96.1 91.8 92.5 93.4 94.8  PLT 494* 536* 340 355 336    Cardiac Enzymes: No results for input(s): CKTOTAL, CKMB, CKMBINDEX, TROPONINI in the last 168 hours.  BNP (last 3 results) No results for input(s): BNP in the last 8760 hours.  ProBNP (last 3 results) No results for input(s): PROBNP in the last 8760 hours.  Radiological Exams: No results found.  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 Acute on chronic respiratory with hypoxia patient  has a 12-hour goal currently on pressure support and FiO2 of 35%.  Continue aggressive pulmonary toilet and supportive measures continue to wean per protocol. 2. Non-small cell cancer of the lung status post right lower lobectomy continue to monitor 3. Severe sepsis with new treated continue to monitor 4. History of pneumothorax continue to follow radiologically continue supportive measures.   I have personally seen and evaluated the patient, evaluated laboratory and imaging results, formulated the assessment and plan and placed orders. The Patient requires high complexity decision making for assessment and support.  Case was discussed on Rounds with the Respiratory Therapy Staff  Allyne Gee, MD [facility] Pulmonary Critical Care Medicine Sleep Medicine

## 2019-04-21 DIAGNOSIS — J9621 Acute and chronic respiratory failure with hypoxia: Secondary | ICD-10-CM | POA: Diagnosis not present

## 2019-04-21 DIAGNOSIS — C3431 Malignant neoplasm of lower lobe, right bronchus or lung: Secondary | ICD-10-CM | POA: Diagnosis not present

## 2019-04-21 DIAGNOSIS — J939 Pneumothorax, unspecified: Secondary | ICD-10-CM | POA: Diagnosis not present

## 2019-04-21 DIAGNOSIS — J181 Lobar pneumonia, unspecified organism: Secondary | ICD-10-CM | POA: Diagnosis not present

## 2019-04-21 LAB — CULTURE, BLOOD (ROUTINE X 2)
Culture: NO GROWTH
Culture: NO GROWTH
Special Requests: ADEQUATE
Special Requests: ADEQUATE

## 2019-04-21 LAB — URINALYSIS, ROUTINE W REFLEX MICROSCOPIC
Bilirubin Urine: NEGATIVE
Glucose, UA: NEGATIVE mg/dL
Ketones, ur: NEGATIVE mg/dL
Nitrite: NEGATIVE
Protein, ur: NEGATIVE mg/dL
Specific Gravity, Urine: 1.011 (ref 1.005–1.030)
pH: 6 (ref 5.0–8.0)

## 2019-04-21 LAB — RENAL FUNCTION PANEL
Albumin: 1.5 g/dL — ABNORMAL LOW (ref 3.5–5.0)
Anion gap: 8 (ref 5–15)
BUN: 48 mg/dL — ABNORMAL HIGH (ref 8–23)
CO2: 38 mmol/L — ABNORMAL HIGH (ref 22–32)
Calcium: 9.3 mg/dL (ref 8.9–10.3)
Chloride: 101 mmol/L (ref 98–111)
Creatinine, Ser: 0.56 mg/dL (ref 0.44–1.00)
GFR calc Af Amer: >60 mL/min (ref >60)
GFR calc non Af Amer: >60 mL/min (ref >60)
Glucose, Bld: 133 mg/dL — ABNORMAL HIGH (ref 70–99)
Phosphorus: 2.5 mg/dL (ref 2.5–4.6)
Potassium: 3.4 mmol/L — ABNORMAL LOW (ref 3.5–5.1)
Sodium: 147 mmol/L — ABNORMAL HIGH (ref 135–145)

## 2019-04-21 LAB — CBC
HCT: 27.9 % — ABNORMAL LOW (ref 36.0–46.0)
Hemoglobin: 8.4 g/dL — ABNORMAL LOW (ref 12.0–15.0)
MCH: 28.4 pg (ref 26.0–34.0)
MCHC: 30.1 g/dL (ref 30.0–36.0)
MCV: 94.3 fL (ref 80.0–100.0)
Platelets: 433 10*3/uL — ABNORMAL HIGH (ref 150–400)
RBC: 2.96 MIL/uL — ABNORMAL LOW (ref 3.87–5.11)
RDW: 16.5 % — ABNORMAL HIGH (ref 11.5–15.5)
WBC: 24.1 10*3/uL — ABNORMAL HIGH (ref 4.0–10.5)
nRBC: 0.2 % (ref 0.0–0.2)

## 2019-04-21 LAB — MAGNESIUM: Magnesium: 2.4 mg/dL (ref 1.7–2.4)

## 2019-04-21 NOTE — Progress Notes (Signed)
Pulmonary Critical Care Medicine [place]   PULMONARY CRITICAL CARE SERVICE  PROGRESS NOTE  Date of Service: 04/21/2019  Amanda Duran  PZW:[id number]  DOB: [birth year]   DOA: 04/03/2019  Referring Physician: Merton Border, MD  HPI: Amanda Duran is a 73 y.o. female seen for follow up of Acute on Chronic Respiratory Failure.  Patient currently is on pressure support mode has been on 35% FiO2 we will try to wean her for 24 hours.  Apparently the family has decided on DNR status so if she is extubated she will not be reintubated is the understanding at this time.  Medications: Reviewed on Rounds  Physical Exam:  Vitals: Temperature 97.7 pulse 80 respiratory 27 blood pressure 170/62 saturations 96%  Ventilator Settings mode ventilation pressure support FiO2 is 35% tidal volume 416 pressure support 12 PEEP 5  . General: Comfortable at this time . Eyes: Grossly normal lids, irises & conjunctiva . ENT: grossly tongue is normal . Neck: no obvious mass . Cardiovascular: S1 S2 normal no gallop . Respiratory: No rhonchi no rales are noted at this time . Abdomen: soft . Skin: no rash seen on limited exam . Musculoskeletal: not rigid . Psychiatric:unable to assess . Neurologic: no seizure no involuntary movements         Lab Data:   Basic Metabolic Panel: Recent Labs  Lab 04/15/19 2012 04/17/19 0554 04/18/19 0607 04/19/19 0605 04/21/19 0459  NA 139 141 144 145 147*  K 5.2* 5.0 3.1* 3.9 3.4*  CL 92* 94* 98 102 101  CO2 30 32 34* 34* 38*  GLUCOSE 142* 99 118* 152* 133*  BUN 34* 65* 49* 59* 48*  CREATININE 1.00 1.12* 0.87 0.91 0.56  CALCIUM 9.0 8.4* 9.1 9.6 9.3  MG  --  2.6* 2.3 2.5* 2.4  PHOS  --   --   --  3.5 2.5    ABG: Recent Labs  Lab 04/15/19 1535 04/15/19 2010 04/15/19 2118 04/20/19 1632  PHART 7.293* 7.238* 7.358 7.419  PCO2ART 89.6* 77.0* 63.8* 64.8*  PO2ART 326* 190* 209* 63.6*  HCO3 42.0* 31.7* 35.0* 41.2*  O2SAT 99.5 98.8 99.4 92.5     Liver Function Tests: Recent Labs  Lab 04/17/19 0554 04/19/19 0605 04/21/19 0459  AST 115*  --   --   ALT 61*  --   --   ALKPHOS 101  --   --   BILITOT 0.5  --   --   PROT 5.3*  --   --   ALBUMIN 1.4* 1.6* 1.5*   No results for input(s): LIPASE, AMYLASE in the last 168 hours. No results for input(s): AMMONIA in the last 168 hours.  CBC: Recent Labs  Lab 04/15/19 2012 04/16/19 0508 04/17/19 1026 04/18/19 0607 04/19/19 0605 04/21/19 0459  WBC 23.7* 24.4* 17.3* 16.3* 15.3* 24.1*  NEUTROABS 18.4*  --   --   --   --   --   HGB 9.0* 8.7* 8.5* 7.7* 8.1* 8.4*  HCT 29.8* 27.9* 27.3* 25.3* 27.1* 27.9*  MCV 96.1 91.8 92.5 93.4 94.8 94.3  PLT 494* 536* 340 355 336 433*    Cardiac Enzymes: No results for input(s): CKTOTAL, CKMB, CKMBINDEX, TROPONINI in the last 168 hours.  BNP (last 3 results) No results for input(s): BNP in the last 8760 hours.  ProBNP (last 3 results) No results for input(s): PROBNP in the last 8760 hours.  Radiological Exams: No results found.  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 Acute on chronic respiratory failure with hypoxia we will going to continue her on the pressure support for 24 hours.  We will reassess her in the morning and consider extubation. 2. Right lower lobe cancer prognosis guarded we will continue with supportive care family has made her DNR 3. Severe sepsis hemodynamically right now resolved 4. Lobar pneumonia we will continue to monitor radiologically unchanged 5. Pneumothorax resolved   I have personally seen and evaluated the patient, evaluated laboratory and imaging results, formulated the assessment and plan and placed orders. The Patient requires high complexity decision making for assessment and support.  Case was discussed on Rounds with the  Respiratory Therapy Staff  Allyne Gee, MD [facility] Pulmonary Critical Care Medicine Sleep Medicine

## 2019-04-22 ENCOUNTER — Other Ambulatory Visit (HOSPITAL_COMMUNITY): Payer: Medicare Other

## 2019-04-22 DIAGNOSIS — J181 Lobar pneumonia, unspecified organism: Secondary | ICD-10-CM | POA: Diagnosis not present

## 2019-04-22 DIAGNOSIS — C3431 Malignant neoplasm of lower lobe, right bronchus or lung: Secondary | ICD-10-CM | POA: Diagnosis not present

## 2019-04-22 DIAGNOSIS — J939 Pneumothorax, unspecified: Secondary | ICD-10-CM | POA: Diagnosis not present

## 2019-04-22 DIAGNOSIS — J9621 Acute and chronic respiratory failure with hypoxia: Secondary | ICD-10-CM | POA: Diagnosis not present

## 2019-04-22 LAB — RENAL FUNCTION PANEL
Albumin: 1.3 g/dL — ABNORMAL LOW (ref 3.5–5.0)
Anion gap: 11 (ref 5–15)
BUN: 45 mg/dL — ABNORMAL HIGH (ref 8–23)
CO2: 35 mmol/L — ABNORMAL HIGH (ref 22–32)
Calcium: 9 mg/dL (ref 8.9–10.3)
Chloride: 99 mmol/L (ref 98–111)
Creatinine, Ser: 0.5 mg/dL (ref 0.44–1.00)
GFR calc Af Amer: >60 mL/min (ref >60)
GFR calc non Af Amer: >60 mL/min (ref >60)
Glucose, Bld: 130 mg/dL — ABNORMAL HIGH (ref 70–99)
Phosphorus: 3 mg/dL (ref 2.5–4.6)
Potassium: 4.7 mmol/L (ref 3.5–5.1)
Sodium: 145 mmol/L (ref 135–145)

## 2019-04-22 LAB — BLOOD GAS, ARTERIAL
Acid-Base Excess: 16.8 mmol/L — ABNORMAL HIGH (ref 0.0–2.0)
Bicarbonate: 42.5 mmol/L — ABNORMAL HIGH (ref 20.0–28.0)
FIO2: 35
O2 Saturation: 95.8 %
PEEP: 5 cmH2O
Patient temperature: 93.2
Pressure support: 12 cmH2O
pCO2 arterial: 58.2 mmHg — ABNORMAL HIGH (ref 32.0–48.0)
pH, Arterial: 7.461 — ABNORMAL HIGH (ref 7.350–7.450)
pO2, Arterial: 62.2 mmHg — ABNORMAL LOW (ref 83.0–108.0)

## 2019-04-22 LAB — CBC
HCT: 27.1 % — ABNORMAL LOW (ref 36.0–46.0)
Hemoglobin: 8 g/dL — ABNORMAL LOW (ref 12.0–15.0)
MCH: 28.4 pg (ref 26.0–34.0)
MCHC: 29.5 g/dL — ABNORMAL LOW (ref 30.0–36.0)
MCV: 96.1 fL (ref 80.0–100.0)
Platelets: 319 10*3/uL (ref 150–400)
RBC: 2.82 MIL/uL — ABNORMAL LOW (ref 3.87–5.11)
RDW: 16.2 % — ABNORMAL HIGH (ref 11.5–15.5)
WBC: 19.8 10*3/uL — ABNORMAL HIGH (ref 4.0–10.5)
nRBC: 0.3 % — ABNORMAL HIGH (ref 0.0–0.2)

## 2019-04-22 LAB — LACTIC ACID, PLASMA: Lactic Acid, Venous: 0.8 mmol/L (ref 0.5–1.9)

## 2019-04-22 LAB — URINE CULTURE: Culture: <10000 — AB

## 2019-04-22 LAB — MAGNESIUM: Magnesium: 2.2 mg/dL (ref 1.7–2.4)

## 2019-04-23 DIAGNOSIS — J939 Pneumothorax, unspecified: Secondary | ICD-10-CM | POA: Diagnosis not present

## 2019-04-23 DIAGNOSIS — J9621 Acute and chronic respiratory failure with hypoxia: Secondary | ICD-10-CM | POA: Diagnosis not present

## 2019-04-23 DIAGNOSIS — C3431 Malignant neoplasm of lower lobe, right bronchus or lung: Secondary | ICD-10-CM | POA: Diagnosis not present

## 2019-04-23 DIAGNOSIS — J181 Lobar pneumonia, unspecified organism: Secondary | ICD-10-CM | POA: Diagnosis not present

## 2019-04-23 LAB — RENAL FUNCTION PANEL
Albumin: 1.4 g/dL — ABNORMAL LOW (ref 3.5–5.0)
Anion gap: 8 (ref 5–15)
BUN: 42 mg/dL — ABNORMAL HIGH (ref 8–23)
CO2: 40 mmol/L — ABNORMAL HIGH (ref 22–32)
Calcium: 9 mg/dL (ref 8.9–10.3)
Chloride: 97 mmol/L — ABNORMAL LOW (ref 98–111)
Creatinine, Ser: 0.51 mg/dL (ref 0.44–1.00)
GFR calc Af Amer: >60 mL/min (ref >60)
GFR calc non Af Amer: >60 mL/min (ref >60)
Glucose, Bld: 133 mg/dL — ABNORMAL HIGH (ref 70–99)
Phosphorus: 3.6 mg/dL (ref 2.5–4.6)
Potassium: 4.3 mmol/L (ref 3.5–5.1)
Sodium: 145 mmol/L (ref 135–145)

## 2019-04-23 LAB — C DIFFICILE QUICK SCREEN W PCR REFLEX
C Diff antigen: NEGATIVE
C Diff interpretation: NOT DETECTED
C Diff toxin: NEGATIVE

## 2019-04-23 LAB — MAGNESIUM: Magnesium: 2.3 mg/dL (ref 1.7–2.4)

## 2019-04-23 NOTE — Progress Notes (Addendum)
Pulmonary Critical Care Medicine [place]   PULMONARY CRITICAL CARE SERVICE  PROGRESS NOTE  Date of Service: 04/23/2019  Amanda Duran  DGU:[id number]  DOB: 28-May-1946   DOA: 04/18/2019  Referring Physician: Merton Border, MD  HPI: Amanda Duran is a 73 y.o. female seen for follow up of Acute on Chronic Respiratory Failure.  Patient remains on full support on the ventilator at this time.  Continues to be orally intubated with a 7.0 ET tube.  Currently requiring 35% FiO2.  Medications: Reviewed on Rounds  Physical Exam:  Vitals: Pulse 83 respirations 25 BP 120/63 O2 sat 97% temp 98.6  Ventilator Settings ventilator mode AC VC rate of 22 tidal volume 380 PEEP of 5 FiO2 of 35%  . General: Comfortable at this time . Eyes: Grossly normal lids, irises & conjunctiva . ENT: grossly tongue is normal . Neck: no obvious mass . Cardiovascular: S1 S2 normal no gallop . Respiratory: No rales or rhonchi noted . Abdomen: soft . Skin: no rash seen on limited exam . Musculoskeletal: not rigid . Psychiatric:unable to assess . Neurologic: no seizure no involuntary movements         Lab Data:   Basic Metabolic Panel: Recent Labs  Lab 04/18/19 0607 04/19/19 0605 04/21/19 0459 04/22/19 0549 04/23/19 0557  NA 144 145 147* 145 145  K 3.1* 3.9 3.4* 4.7 4.3  CL 98 102 101 99 97*  CO2 34* 34* 38* 35* 40*  GLUCOSE 118* 152* 133* 130* 133*  BUN 49* 59* 48* 45* 42*  CREATININE 0.87 0.91 0.56 0.50 0.51  CALCIUM 9.1 9.6 9.3 9.0 9.0  MG 2.3 2.5* 2.4 2.2 2.3  PHOS  --  3.5 2.5 3.0 3.6    ABG: Recent Labs  Lab 04/20/19 1632 04/22/19 0719  PHART 7.419 7.461*  PCO2ART 64.8* 58.2*  PO2ART 63.6* 62.2*  HCO3 41.2* 42.5*  O2SAT 92.5 95.8    Liver Function Tests: Recent Labs  Lab 04/17/19 0554 04/19/19 0605 04/21/19 0459 04/22/19 0549 04/23/19 0557  AST 115*  --   --   --   --   ALT 61*  --   --   --   --   ALKPHOS 101  --   --   --   --   BILITOT 0.5  --   --    --   --   PROT 5.3*  --   --   --   --   ALBUMIN 1.4* 1.6* 1.5* 1.3* 1.4*   No results for input(s): LIPASE, AMYLASE in the last 168 hours. No results for input(s): AMMONIA in the last 168 hours.  CBC: Recent Labs  Lab 04/17/19 1026 04/18/19 0607 04/19/19 0605 04/21/19 0459 04/22/19 0549  WBC 17.3* 16.3* 15.3* 24.1* 19.8*  HGB 8.5* 7.7* 8.1* 8.4* 8.0*  HCT 27.3* 25.3* 27.1* 27.9* 27.1*  MCV 92.5 93.4 94.8 94.3 96.1  PLT 340 355 336 433* 319    Cardiac Enzymes: No results for input(s): CKTOTAL, CKMB, CKMBINDEX, TROPONINI in the last 168 hours.  BNP (last 3 results) No results for input(s): BNP in the last 8760 hours.  ProBNP (last 3 results) No results for input(s): PROBNP in the last 8760 hours.  Radiological Exams: Dg Chest Port 1 View  Result Date: 04/22/2019 CLINICAL DATA:  Respiratory failure. EXAM: PORTABLE CHEST 1 VIEW COMPARISON:  04/18/2019. 04/16/2019. 03/29/2019. 03/28/2019. CT 04/14/2019. FINDINGS: Endotracheal tube and feeding tube in stable position. Heart size stable. Diffuse interstitial prominence again noted  without interim change. Atelectatic changes right base with right-sided pleural thickening/effusion again noted. Right apical pleural thickening again noted consistent scarring. Surgical drain noted over the right base. IMPRESSION: 1.  Lines and tubes stable position. 2. Diffuse interstitial prominence again noted without interim change. Atelectatic changes again noted over the right base with right-sided pleural thickening and/or effusion. Surgical drain noted over the right base. No pneumothorax. 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 Acute on chronic respiratory failure with hypoxia patient will continue on  full support at this time.  We will continue to titrate FiO2 as tolerated.  Continue supportive measures and pulmonary toilet. 2. Right lower lobe cancer prognosis guarded we will continue with supportive care family has made her DNR 3. Severe sepsis hemodynamically right now resolved 4. Lobar pneumonia we will continue to monitor radiologically unchanged 5. Pneumothorax resolved   I have personally seen and evaluated the patient, evaluated laboratory and imaging results, formulated the assessment and plan and placed orders. The Patient requires high complexity decision making for assessment and support.  Case was discussed on Rounds with the Respiratory Therapy Staff  Allyne Gee, MD [facility] Pulmonary Critical Care Medicine Sleep Medicine

## 2019-04-23 NOTE — Progress Notes (Addendum)
Pulmonary Critical Care Medicine [place]   PULMONARY CRITICAL CARE SERVICE  PROGRESS NOTE  Date of Service: 04/23/2019  Amanda Duran  HYQ:[id number]  DOB: [birth year]   DOA: 04/12/2019  Referring Physician: Merton Border, MD  HPI: Amanda Duran is a 73 y.o. female seen for follow up of Acute on Chronic Respiratory Failure.  Patient remained intubated with a 7.0 ET tube 23 at the lip.  Remains on full support at this time currently requiring 35% FiO2.  Satting well but distress.  Medications: Reviewed on Rounds  Physical Exam:  Vitals: Pulse 79 respirations 23 BP 129/55 O2 sat 100% temp 97.5  Ventilator Settings ventilator mode AC VC rate of 24 tidal volume 380 PEEP of 5 FiO2 of 35%  . General: Comfortable at this time . Eyes: Grossly normal lids, irises & conjunctiva . ENT: grossly tongue is normal . Neck: no obvious mass . Cardiovascular: S1 S2 normal no gallop . Respiratory: No rales or rhonchi noted . Abdomen: soft . Skin: no rash seen on limited exam . Musculoskeletal: not rigid . Psychiatric:unable to assess . Neurologic: no seizure no involuntary movements         Lab Data:   Basic Metabolic Panel: Recent Labs  Lab 04/18/19 0607 04/19/19 0605 04/21/19 0459 04/22/19 0549 04/23/19 0557  NA 144 145 147* 145 145  K 3.1* 3.9 3.4* 4.7 4.3  CL 98 102 101 99 97*  CO2 34* 34* 38* 35* 40*  GLUCOSE 118* 152* 133* 130* 133*  BUN 49* 59* 48* 45* 42*  CREATININE 0.87 0.91 0.56 0.50 0.51  CALCIUM 9.1 9.6 9.3 9.0 9.0  MG 2.3 2.5* 2.4 2.2 2.3  PHOS  --  3.5 2.5 3.0 3.6    ABG: Recent Labs  Lab 04/20/19 1632 04/22/19 0719  PHART 7.419 7.461*  PCO2ART 64.8* 58.2*  PO2ART 63.6* 62.2*  HCO3 41.2* 42.5*  O2SAT 92.5 95.8    Liver Function Tests: Recent Labs  Lab 04/17/19 0554 04/19/19 0605 04/21/19 0459 04/22/19 0549 04/23/19 0557  AST 115*  --   --   --   --   ALT 61*  --   --   --   --   ALKPHOS 101  --   --   --   --   BILITOT 0.5   --   --   --   --   PROT 5.3*  --   --   --   --   ALBUMIN 1.4* 1.6* 1.5* 1.3* 1.4*   No results for input(s): LIPASE, AMYLASE in the last 168 hours. No results for input(s): AMMONIA in the last 168 hours.  CBC: Recent Labs  Lab 04/17/19 1026 04/18/19 0607 04/19/19 0605 04/21/19 0459 04/22/19 0549  WBC 17.3* 16.3* 15.3* 24.1* 19.8*  HGB 8.5* 7.7* 8.1* 8.4* 8.0*  HCT 27.3* 25.3* 27.1* 27.9* 27.1*  MCV 92.5 93.4 94.8 94.3 96.1  PLT 340 355 336 433* 319    Cardiac Enzymes: No results for input(s): CKTOTAL, CKMB, CKMBINDEX, TROPONINI in the last 168 hours.  BNP (last 3 results) No results for input(s): BNP in the last 8760 hours.  ProBNP (last 3 results) No results for input(s): PROBNP in the last 8760 hours.  Radiological Exams: Dg Chest Port 1 View  Result Date: 04/22/2019 CLINICAL DATA:  Respiratory failure. EXAM: PORTABLE CHEST 1 VIEW COMPARISON:  04/18/2019. 04/16/2019. 03/29/2019. 03/28/2019. CT 04/14/2019. FINDINGS: Endotracheal tube and feeding tube in stable position. Heart size stable. Diffuse interstitial prominence  again noted without interim change. Atelectatic changes right base with right-sided pleural thickening/effusion again noted. Right apical pleural thickening again noted consistent scarring. Surgical drain noted over the right base. IMPRESSION: 1.  Lines and tubes stable position. 2. Diffuse interstitial prominence again noted without interim change. Atelectatic changes again noted over the right base with right-sided pleural thickening and/or effusion. Surgical drain noted over the right base. No pneumothorax. 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 Acute on chronic respiratory failure with hypoxia patient will  continue on full support at this time.  We will continue to titrate FiO2 as tolerated.  Continue supportive measures and pulmonary toilet. 2. Right lower lobe cancer prognosis guarded we will continue with supportive care family has made her DNR 3. Severe sepsis hemodynamically right now resolved 4. Lobar pneumonia we will continue to monitor radiologically unchanged 5. Pneumothorax resolved   I have personally seen and evaluated the patient, evaluated laboratory and imaging results, formulated the assessment and plan and placed orders. The Patient requires high complexity decision making for assessment and support.  Case was discussed on Rounds with the Respiratory Therapy Staff  Allyne Gee, MD [facility] Pulmonary Critical Care Medicine Sleep Medicine

## 2019-04-24 DIAGNOSIS — J939 Pneumothorax, unspecified: Secondary | ICD-10-CM | POA: Diagnosis not present

## 2019-04-24 DIAGNOSIS — J181 Lobar pneumonia, unspecified organism: Secondary | ICD-10-CM | POA: Diagnosis not present

## 2019-04-24 DIAGNOSIS — J9621 Acute and chronic respiratory failure with hypoxia: Secondary | ICD-10-CM | POA: Diagnosis not present

## 2019-04-24 DIAGNOSIS — C3431 Malignant neoplasm of lower lobe, right bronchus or lung: Secondary | ICD-10-CM | POA: Diagnosis not present

## 2019-04-24 LAB — CULTURE, RESPIRATORY W GRAM STAIN

## 2019-04-24 LAB — CBC
HCT: 27 % — ABNORMAL LOW (ref 36.0–46.0)
Hemoglobin: 8 g/dL — ABNORMAL LOW (ref 12.0–15.0)
MCH: 28.2 pg (ref 26.0–34.0)
MCHC: 29.6 g/dL — ABNORMAL LOW (ref 30.0–36.0)
MCV: 95.1 fL (ref 80.0–100.0)
Platelets: 346 10*3/uL (ref 150–400)
RBC: 2.84 MIL/uL — ABNORMAL LOW (ref 3.87–5.11)
RDW: 16.3 % — ABNORMAL HIGH (ref 11.5–15.5)
WBC: 19.9 10*3/uL — ABNORMAL HIGH (ref 4.0–10.5)
nRBC: 0.1 % (ref 0.0–0.2)

## 2019-04-24 LAB — RENAL FUNCTION PANEL
Albumin: 1.4 g/dL — ABNORMAL LOW (ref 3.5–5.0)
Anion gap: 8 (ref 5–15)
BUN: 46 mg/dL — ABNORMAL HIGH (ref 8–23)
CO2: 39 mmol/L — ABNORMAL HIGH (ref 22–32)
Calcium: 8.9 mg/dL (ref 8.9–10.3)
Chloride: 97 mmol/L — ABNORMAL LOW (ref 98–111)
Creatinine, Ser: 0.54 mg/dL (ref 0.44–1.00)
GFR calc Af Amer: >60 mL/min (ref >60)
GFR calc non Af Amer: >60 mL/min (ref >60)
Glucose, Bld: 130 mg/dL — ABNORMAL HIGH (ref 70–99)
Phosphorus: 4.1 mg/dL (ref 2.5–4.6)
Potassium: 4.2 mmol/L (ref 3.5–5.1)
Sodium: 144 mmol/L (ref 135–145)

## 2019-04-24 LAB — MAGNESIUM: Magnesium: 2.3 mg/dL (ref 1.7–2.4)

## 2019-04-24 NOTE — Progress Notes (Signed)
Pulmonary Critical Care Medicine [place]   PULMONARY CRITICAL CARE SERVICE  PROGRESS NOTE  Date of Service: 04/24/2019  Amanda Duran  [id number]  DOB: 1946/11/02   DOA: 03/31/2019  Referring Physician: Merton Border, MD  HPI: Amanda Duran is a 73 y.o. female seen for follow up of Acute on Chronic Respiratory Failure.  Patient is on full vent support again patient was not able to tolerate the wean  Medications: Reviewed on Rounds  Physical Exam:  Vitals: Temperature 98.8 pulse 79 respiratory rate 22 blood pressure is 130/61  Ventilator Settings mode ventilation assist control FiO2 35% tidal volume 397 PEEP 5  . General: Comfortable at this time . Eyes: Grossly normal lids, irises & conjunctiva . ENT: grossly tongue is normal . Neck: no obvious mass . Cardiovascular: S1 S2 normal no gallop . Respiratory: No rhonchi or rales are noted at this time . Abdomen: soft . Skin: no rash seen on limited exam . Musculoskeletal: not rigid . Psychiatric:unable to assess . Neurologic: no seizure no involuntary movements         Lab Data:   Basic Metabolic Panel: Recent Labs  Lab 04/19/19 0605 04/21/19 0459 04/22/19 0549 04/23/19 0557 04/24/19 0938  NA 145 147* 145 145 144  K 3.9 3.4* 4.7 4.3 4.2  CL 102 101 99 97* 97*  CO2 34* 38* 35* 40* 39*  GLUCOSE 152* 133* 130* 133* 130*  BUN 59* 48* 45* 42* 46*  CREATININE 0.91 0.56 0.50 0.51 0.54  CALCIUM 9.6 9.3 9.0 9.0 8.9  MG 2.5* 2.4 2.2 2.3 2.3  PHOS 3.5 2.5 3.0 3.6 4.1    ABG: Recent Labs  Lab 04/20/19 1632 04/22/19 0719  PHART 7.419 7.461*  PCO2ART 64.8* 58.2*  PO2ART 63.6* 62.2*  HCO3 41.2* 42.5*  O2SAT 92.5 95.8    Liver Function Tests: Recent Labs  Lab 04/19/19 0605 04/21/19 0459 04/22/19 0549 04/23/19 0557 04/24/19 0938  ALBUMIN 1.6* 1.5* 1.3* 1.4* 1.4*   No results for input(s): LIPASE, AMYLASE in the last 168 hours. No results for input(s): AMMONIA in the last 168  hours.  CBC: Recent Labs  Lab 04/18/19 0607 04/19/19 0605 04/21/19 0459 04/22/19 0549 04/24/19 0938  WBC 16.3* 15.3* 24.1* 19.8* 19.9*  HGB 7.7* 8.1* 8.4* 8.0* 8.0*  HCT 25.3* 27.1* 27.9* 27.1* 27.0*  MCV 93.4 94.8 94.3 96.1 95.1  PLT 355 336 433* 319 346    Cardiac Enzymes: No results for input(s): CKTOTAL, CKMB, CKMBINDEX, TROPONINI in the last 168 hours.  BNP (last 3 results) No results for input(s): BNP in the last 8760 hours.  ProBNP (last 3 results) No results for input(s): PROBNP in the last 8760 hours.  Radiological Exams: No results found.  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 Acute on chronic respiratory failure with hypoxia we will continue with full support on assist control mode currently on 35% FiO2 pressure support was stopped because of increased work of breathing that was noted 2. Cancer of the lower lobe status post resection we will continue to monitor 3. Severe sepsis he right now hemodynamically stable 4. Lobar pneumonia treated we will continue to follow 5. Pneumothorax resolved   I have personally seen and evaluated the patient, evaluated laboratory and imaging results, formulated the assessment and plan and  placed orders. The Patient requires high complexity decision making for assessment and support.  Case was discussed on Rounds with the Respiratory Therapy Staff  Allyne Gee, MD [facility] Pulmonary Critical Care Medicine Sleep Medicine

## 2019-04-25 ENCOUNTER — Other Ambulatory Visit (HOSPITAL_COMMUNITY): Payer: Medicare Other

## 2019-04-25 DIAGNOSIS — J939 Pneumothorax, unspecified: Secondary | ICD-10-CM | POA: Diagnosis not present

## 2019-04-25 DIAGNOSIS — C3431 Malignant neoplasm of lower lobe, right bronchus or lung: Secondary | ICD-10-CM | POA: Diagnosis not present

## 2019-04-25 DIAGNOSIS — J181 Lobar pneumonia, unspecified organism: Secondary | ICD-10-CM | POA: Diagnosis not present

## 2019-04-25 DIAGNOSIS — J9621 Acute and chronic respiratory failure with hypoxia: Secondary | ICD-10-CM | POA: Diagnosis not present

## 2019-04-25 LAB — BLOOD GAS, ARTERIAL
Acid-Base Excess: 16.7 mmol/L — ABNORMAL HIGH (ref 0.0–2.0)
Bicarbonate: 41.9 mmol/L — ABNORMAL HIGH (ref 20.0–28.0)
FIO2: 35
MECHVT: 380 mL
O2 Saturation: 95.8 %
PEEP: 5 cmH2O
Patient temperature: 98.6
RATE: 22 resp/min
pCO2 arterial: 60 mmHg — ABNORMAL HIGH (ref 32.0–48.0)
pH, Arterial: 7.458 — ABNORMAL HIGH (ref 7.350–7.450)
pO2, Arterial: 78.9 mmHg — ABNORMAL LOW (ref 83.0–108.0)

## 2019-04-25 NOTE — Progress Notes (Addendum)
Pulmonary Critical Care Medicine [place]   PULMONARY CRITICAL CARE SERVICE  PROGRESS NOTE  Date of Service: 04/25/2019  Amanda Duran  [id number]  DOB: 14-May-1946   DOA: 03/26/2019  Referring Physician: Merton Border, MD  HPI: Amanda Duran is a 73 y.o. female seen for follow up of Acute on Chronic Respiratory Failure.  Patient continues on pressure support 12/5 with an FiO2 of 35% no fever or distress noted at this time.  Patient satting well.  Medications: Reviewed on Rounds  Physical Exam:  Vitals: Pulse 80 respirations 26 BP 121/58 O2 sat 96% temp 98.2  Ventilator Settings pressure support 25 FiO2 of 35%  . General: Comfortable at this time . Eyes: Grossly normal lids, irises & conjunctiva . ENT: grossly tongue is normal . Neck: no obvious mass . Cardiovascular: S1 S2 normal no gallop . Respiratory: No rales or rhonchi noted . Abdomen: soft . Skin: no rash seen on limited exam . Musculoskeletal: not rigid . Psychiatric:unable to assess . Neurologic: no seizure no involuntary movements         Lab Data:   Basic Metabolic Panel: Recent Labs  Lab 04/19/19 0605 04/21/19 0459 04/22/19 0549 04/23/19 0557 04/24/19 0938  NA 145 147* 145 145 144  K 3.9 3.4* 4.7 4.3 4.2  CL 102 101 99 97* 97*  CO2 34* 38* 35* 40* 39*  GLUCOSE 152* 133* 130* 133* 130*  BUN 59* 48* 45* 42* 46*  CREATININE 0.91 0.56 0.50 0.51 0.54  CALCIUM 9.6 9.3 9.0 9.0 8.9  MG 2.5* 2.4 2.2 2.3 2.3  PHOS 3.5 2.5 3.0 3.6 4.1    ABG: Recent Labs  Lab 04/20/19 1632 04/22/19 0719 04/25/19 0448  PHART 7.419 7.461* 7.458*  PCO2ART 64.8* 58.2* 60.0*  PO2ART 63.6* 62.2* 78.9*  HCO3 41.2* 42.5* 41.9*  O2SAT 92.5 95.8 95.8    Liver Function Tests: Recent Labs  Lab 04/19/19 0605 04/21/19 0459 04/22/19 0549 04/23/19 0557 04/24/19 0938  ALBUMIN 1.6* 1.5* 1.3* 1.4* 1.4*   No results for input(s): LIPASE, AMYLASE in the last 168 hours. No results for input(s): AMMONIA  in the last 168 hours.  CBC: Recent Labs  Lab 04/19/19 0605 04/21/19 0459 04/22/19 0549 04/24/19 0938  WBC 15.3* 24.1* 19.8* 19.9*  HGB 8.1* 8.4* 8.0* 8.0*  HCT 27.1* 27.9* 27.1* 27.0*  MCV 94.8 94.3 96.1 95.1  PLT 336 433* 319 346    Cardiac Enzymes: No results for input(s): CKTOTAL, CKMB, CKMBINDEX, TROPONINI in the last 168 hours.  BNP (last 3 results) No results for input(s): BNP in the last 8760 hours.  ProBNP (last 3 results) No results for input(s): PROBNP in the last 8760 hours.  Radiological Exams: Dg Chest Port 1 View  Result Date: 04/25/2019 CLINICAL DATA:  Pulmonary edema EXAM: PORTABLE CHEST 1 VIEW COMPARISON:  04/22/2019 FINDINGS: Cardiac shadow is stable. Endotracheal tube and feeding catheter in place. Right-sided pleural effusion is noted similar to that seen on the prior exam. Surgical drain is noted overlying the right upper quadrant similar to that seen on the prior exam. Persistent right basilar interstitial changes are noted. IMPRESSION: No significant interval change from the prior exam. Electronically Signed   By: Inez Catalina M.D.   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 Acute on chronic respiratory failure with hypoxia we will continue with pressure support 35% FiO2 as tolerated.  Continue supportive measures and pulmonary toilet at this time. 2. Cancer of the lower lobe status post resection we will continue to monitor 3. Severe sepsis he right now hemodynamically stable 4. Lobar pneumonia treated we will continue to follow 5. Pneumothorax resolved   I have personally seen and evaluated the patient, evaluated laboratory and imaging results, formulated the assessment and plan and placed orders. The Patient requires high complexity decision making for  assessment and support.  Case was discussed on Rounds with the Respiratory Therapy Staff  Allyne Gee, MD [facility] Pulmonary Critical Care Medicine Sleep Medicine

## 2019-04-25 DEATH — deceased

## 2019-04-26 DIAGNOSIS — J9621 Acute and chronic respiratory failure with hypoxia: Secondary | ICD-10-CM | POA: Diagnosis not present

## 2019-04-26 DIAGNOSIS — J181 Lobar pneumonia, unspecified organism: Secondary | ICD-10-CM | POA: Diagnosis not present

## 2019-04-26 DIAGNOSIS — C3431 Malignant neoplasm of lower lobe, right bronchus or lung: Secondary | ICD-10-CM | POA: Diagnosis not present

## 2019-04-26 DIAGNOSIS — J939 Pneumothorax, unspecified: Secondary | ICD-10-CM | POA: Diagnosis not present

## 2019-04-26 LAB — CBC
HCT: 26.6 % — ABNORMAL LOW (ref 36.0–46.0)
Hemoglobin: 7.9 g/dL — ABNORMAL LOW (ref 12.0–15.0)
MCH: 27.9 pg (ref 26.0–34.0)
MCHC: 29.7 g/dL — ABNORMAL LOW (ref 30.0–36.0)
MCV: 94 fL (ref 80.0–100.0)
Platelets: 331 10*3/uL (ref 150–400)
RBC: 2.83 MIL/uL — ABNORMAL LOW (ref 3.87–5.11)
RDW: 16.2 % — ABNORMAL HIGH (ref 11.5–15.5)
WBC: 16.5 10*3/uL — ABNORMAL HIGH (ref 4.0–10.5)
nRBC: 0.1 % (ref 0.0–0.2)

## 2019-04-26 LAB — BASIC METABOLIC PANEL
Anion gap: 10 (ref 5–15)
BUN: 51 mg/dL — ABNORMAL HIGH (ref 8–23)
CO2: 37 mmol/L — ABNORMAL HIGH (ref 22–32)
Calcium: 8.7 mg/dL — ABNORMAL LOW (ref 8.9–10.3)
Chloride: 94 mmol/L — ABNORMAL LOW (ref 98–111)
Creatinine, Ser: 0.66 mg/dL (ref 0.44–1.00)
GFR calc Af Amer: >60 mL/min (ref >60)
GFR calc non Af Amer: >60 mL/min (ref >60)
Glucose, Bld: 128 mg/dL — ABNORMAL HIGH (ref 70–99)
Potassium: 4 mmol/L (ref 3.5–5.1)
Sodium: 141 mmol/L (ref 135–145)

## 2019-04-26 LAB — MAGNESIUM: Magnesium: 2.3 mg/dL (ref 1.7–2.4)

## 2019-04-26 NOTE — Progress Notes (Addendum)
Pulmonary Critical Care Medicine [place]   PULMONARY CRITICAL CARE SERVICE  PROGRESS NOTE  Date of Service: 04/26/2019  Amanda Duran  OIZ:[id number]  DOB: 06-Mar-1946   DOA: 04/23/2019  Referring Physician: Merton Border, MD  HPI: Amanda Duran is a 73 y.o. female seen for follow up of Acute on Chronic Respiratory Failure.  Patient remains on per support as tolerated has now been 24 hours.  Remains on 35% FiO2.  Satting well with no distress or fever noted.  Medications: Reviewed on Rounds  Physical Exam:  Vitals: Pulse 69 respirations 22 BP one 5/46 O2 sat 97% 97.2  Ventilator Settings pressure support 12/5 FiO2 35%  . General: Comfortable at this time . Eyes: Grossly normal lids, irises & conjunctiva . ENT: grossly tongue is normal . Neck: no obvious mass . Cardiovascular: S1 S2 normal no gallop . Respiratory: No rales or rhonchi noted . Abdomen: soft . Skin: no rash seen on limited exam . Musculoskeletal: not rigid . Psychiatric:unable to assess . Neurologic: no seizure no involuntary movements         Lab Data:   Basic Metabolic Panel: Recent Labs  Lab 04/21/19 0459 04/22/19 0549 04/23/19 0557 04/24/19 0938 04/26/19 0748  NA 147* 145 145 144 141  K 3.4* 4.7 4.3 4.2 4.0  CL 101 99 97* 97* 94*  CO2 38* 35* 40* 39* 37*  GLUCOSE 133* 130* 133* 130* 128*  BUN 48* 45* 42* 46* 51*  CREATININE 0.56 0.50 0.51 0.54 0.66  CALCIUM 9.3 9.0 9.0 8.9 8.7*  MG 2.4 2.2 2.3 2.3 2.3  PHOS 2.5 3.0 3.6 4.1  --     ABG: Recent Labs  Lab 04/20/19 1632 04/22/19 0719 04/25/19 0448  PHART 7.419 7.461* 7.458*  PCO2ART 64.8* 58.2* 60.0*  PO2ART 63.6* 62.2* 78.9*  HCO3 41.2* 42.5* 41.9*  O2SAT 92.5 95.8 95.8    Liver Function Tests: Recent Labs  Lab 04/21/19 0459 04/22/19 0549 04/23/19 0557 04/24/19 0938  ALBUMIN 1.5* 1.3* 1.4* 1.4*   No results for input(s): LIPASE, AMYLASE in the last 168 hours. No results for input(s): AMMONIA in the last  168 hours.  CBC: Recent Labs  Lab 04/21/19 0459 04/22/19 0549 04/24/19 0938 04/26/19 0748  WBC 24.1* 19.8* 19.9* 16.5*  HGB 8.4* 8.0* 8.0* 7.9*  HCT 27.9* 27.1* 27.0* 26.6*  MCV 94.3 96.1 95.1 94.0  PLT 433* 319 346 331    Cardiac Enzymes: No results for input(s): CKTOTAL, CKMB, CKMBINDEX, TROPONINI in the last 168 hours.  BNP (last 3 results) No results for input(s): BNP in the last 8760 hours.  ProBNP (last 3 results) No results for input(s): PROBNP in the last 8760 hours.  Radiological Exams: Dg Chest Port 1 View  Result Date: 04/25/2019 CLINICAL DATA:  Pulmonary edema EXAM: PORTABLE CHEST 1 VIEW COMPARISON:  04/22/2019 FINDINGS: Cardiac shadow is stable. Endotracheal tube and feeding catheter in place. Right-sided pleural effusion is noted similar to that seen on the prior exam. Surgical drain is noted overlying the right upper quadrant similar to that seen on the prior exam. Persistent right basilar interstitial changes are noted. IMPRESSION: No significant interval change from the prior exam. Electronically Signed   By: Inez Catalina M.D.   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 Acute on chronic respiratory failure with hypoxia we will continue with pressure support 35% FiO2 as tolerated.  Continue supportive measures and pulmonary toilet at this time. 2. Cancer of the lower lobe status post resection we will continue to monitor 3. Severe sepsis he right now hemodynamically stable 4. Lobar pneumonia treated we will continue to follow 5. Pneumothorax resolved   I have personally seen and evaluated the patient, evaluated laboratory and imaging results, formulated the assessment and plan and placed orders. The Patient requires high complexity decision making for assessment and  support.  Case was discussed on Rounds with the Respiratory Therapy Staff  Allyne Gee, MD [facility] Pulmonary Critical Care Medicine Sleep Medicine

## 2019-04-27 DIAGNOSIS — J939 Pneumothorax, unspecified: Secondary | ICD-10-CM | POA: Diagnosis not present

## 2019-04-27 DIAGNOSIS — C3431 Malignant neoplasm of lower lobe, right bronchus or lung: Secondary | ICD-10-CM | POA: Diagnosis not present

## 2019-04-27 DIAGNOSIS — J181 Lobar pneumonia, unspecified organism: Secondary | ICD-10-CM | POA: Diagnosis not present

## 2019-04-27 DIAGNOSIS — J9621 Acute and chronic respiratory failure with hypoxia: Secondary | ICD-10-CM | POA: Diagnosis not present

## 2019-04-27 NOTE — Progress Notes (Addendum)
Pulmonary Critical Care Medicine [place]   PULMONARY CRITICAL CARE SERVICE  PROGRESS NOTE  Date of Service: 04/27/2019  Amanda Duran  XMI:[id number]  DOB: 1946-02-19   DOA: 03/27/2019  Referring Physician: Merton Border, MD  HPI: Amanda Duran is a 73 y.o. female seen for follow up of Acute on Chronic Respiratory Failure.  Patient is on pressure support as tolerated currently been 48 hours on 12/5 with an FiO2 of 35%.  Medications: Reviewed on Rounds  Physical Exam:  Vitals: Pulse 69 respirations 26 BP 106/45 O2 sat 97% temp 97.1  Ventilator Settings pressure support 12/5 FiO2 35%  . General: Comfortable at this time . Eyes: Grossly normal lids, irises & conjunctiva . ENT: grossly tongue is normal . Neck: no obvious mass . Cardiovascular: S1 S2 normal no gallop . Respiratory: No rales or rhonchi noted . Abdomen: soft . Skin: no rash seen on limited exam . Musculoskeletal: not rigid . Psychiatric:unable to assess . Neurologic: no seizure no involuntary movements         Lab Data:   Basic Metabolic Panel: Recent Labs  Lab 04/21/19 0459 04/22/19 0549 04/23/19 0557 04/24/19 0938 04/26/19 0748  NA 147* 145 145 144 141  K 3.4* 4.7 4.3 4.2 4.0  CL 101 99 97* 97* 94*  CO2 38* 35* 40* 39* 37*  GLUCOSE 133* 130* 133* 130* 128*  BUN 48* 45* 42* 46* 51*  CREATININE 0.56 0.50 0.51 0.54 0.66  CALCIUM 9.3 9.0 9.0 8.9 8.7*  MG 2.4 2.2 2.3 2.3 2.3  PHOS 2.5 3.0 3.6 4.1  --     ABG: Recent Labs  Lab 04/22/19 0719 04/25/19 0448  PHART 7.461* 7.458*  PCO2ART 58.2* 60.0*  PO2ART 62.2* 78.9*  HCO3 42.5* 41.9*  O2SAT 95.8 95.8    Liver Function Tests: Recent Labs  Lab 04/21/19 0459 04/22/19 0549 04/23/19 0557 04/24/19 0938  ALBUMIN 1.5* 1.3* 1.4* 1.4*   No results for input(s): LIPASE, AMYLASE in the last 168 hours. No results for input(s): AMMONIA in the last 168 hours.  CBC: Recent Labs  Lab 04/21/19 0459 04/22/19 0549 04/24/19 0938  04/26/19 0748  WBC 24.1* 19.8* 19.9* 16.5*  HGB 8.4* 8.0* 8.0* 7.9*  HCT 27.9* 27.1* 27.0* 26.6*  MCV 94.3 96.1 95.1 94.0  PLT 433* 319 346 331    Cardiac Enzymes: No results for input(s): CKTOTAL, CKMB, CKMBINDEX, TROPONINI in the last 168 hours.  BNP (last 3 results) No results for input(s): BNP in the last 8760 hours.  ProBNP (last 3 results) No results for input(s): PROBNP in the last 8760 hours.  Radiological Exams: No results found.  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 Acute on chronic respiratory failure with hypoxia we will continuewith pressure support 35% FiO2 as tolerated. Continue supportive measures and pulmonary toilet at this time. 2. Cancer of the lower lobe status post resection we will continue to monitor 3. Severe sepsis he right now hemodynamically stable 4. Lobar pneumonia treated we will continue to follow 5. Pneumothorax resolved   I have personally seen and evaluated the patient, evaluated laboratory and imaging results, formulated the assessment and plan and placed orders. The Patient requires high complexity decision making for assessment and support.  Case was discussed on Rounds with the Respiratory Therapy Staff  Allyne Gee, MD [facility] Pulmonary Critical Care Medicine Sleep Medicine

## 2019-04-28 DIAGNOSIS — J181 Lobar pneumonia, unspecified organism: Secondary | ICD-10-CM | POA: Diagnosis not present

## 2019-04-28 DIAGNOSIS — C3431 Malignant neoplasm of lower lobe, right bronchus or lung: Secondary | ICD-10-CM | POA: Diagnosis not present

## 2019-04-28 DIAGNOSIS — J9621 Acute and chronic respiratory failure with hypoxia: Secondary | ICD-10-CM | POA: Diagnosis not present

## 2019-04-28 DIAGNOSIS — J939 Pneumothorax, unspecified: Secondary | ICD-10-CM | POA: Diagnosis not present

## 2019-04-28 LAB — CBC
HCT: 27.4 % — ABNORMAL LOW (ref 36.0–46.0)
Hemoglobin: 8.3 g/dL — ABNORMAL LOW (ref 12.0–15.0)
MCH: 28.1 pg (ref 26.0–34.0)
MCHC: 30.3 g/dL (ref 30.0–36.0)
MCV: 92.9 fL (ref 80.0–100.0)
Platelets: 388 10*3/uL (ref 150–400)
RBC: 2.95 MIL/uL — ABNORMAL LOW (ref 3.87–5.11)
RDW: 16.3 % — ABNORMAL HIGH (ref 11.5–15.5)
WBC: 20 10*3/uL — ABNORMAL HIGH (ref 4.0–10.5)
nRBC: 0 % (ref 0.0–0.2)

## 2019-04-28 LAB — BLOOD GAS, ARTERIAL
Acid-Base Excess: 18 mmol/L — ABNORMAL HIGH (ref 0.0–2.0)
Bicarbonate: 43.1 mmol/L — ABNORMAL HIGH (ref 20.0–28.0)
Drawn by: 51185
FIO2: 35
MECHVT: 380 mL
O2 Saturation: 98.2 %
PEEP: 5 cmH2O
Patient temperature: 98.5
RATE: 22 resp/min
pCO2 arterial: 58.5 mmHg — ABNORMAL HIGH (ref 32.0–48.0)
pH, Arterial: 7.479 — ABNORMAL HIGH (ref 7.350–7.450)
pO2, Arterial: 98 mmHg (ref 83.0–108.0)

## 2019-04-28 LAB — BASIC METABOLIC PANEL
Anion gap: 7 (ref 5–15)
BUN: 33 mg/dL — ABNORMAL HIGH (ref 8–23)
CO2: 41 mmol/L — ABNORMAL HIGH (ref 22–32)
Calcium: 8.9 mg/dL (ref 8.9–10.3)
Chloride: 90 mmol/L — ABNORMAL LOW (ref 98–111)
Creatinine, Ser: 0.33 mg/dL — ABNORMAL LOW (ref 0.44–1.00)
GFR calc Af Amer: >60 mL/min (ref >60)
GFR calc non Af Amer: >60 mL/min (ref >60)
Glucose, Bld: 116 mg/dL — ABNORMAL HIGH (ref 70–99)
Potassium: 3.8 mmol/L (ref 3.5–5.1)
Sodium: 138 mmol/L (ref 135–145)

## 2019-04-28 LAB — MAGNESIUM: Magnesium: 2.1 mg/dL (ref 1.7–2.4)

## 2019-04-28 NOTE — Progress Notes (Addendum)
Pulmonary Critical Care Medicine [place]   PULMONARY CRITICAL CARE SERVICE  PROGRESS NOTE  Date of Service: 04/28/2019  Amanda Duran  OVF:[id number]  DOB: July 11, 1946   DOA: 04/24/2019  Referring Physician: Merton Border, MD  HPI: Amanda Duran is a 73 y.o. female seen for follow up of Acute on Chronic Respiratory Failure.  Patient managed to be on pressure support all night last night however this morning respiratory therapy reports that the patient appeared tired so was placed back on full support to rest for the day.  Currently requiring 35% FiO2.  Medications: Reviewed on Rounds  Physical Exam:  Vitals: Pulse 70 respiration 30 BP 118/63 O2 sat 99% temp 97.0  Ventilator Settings ventilator mode AC VC rate of 22 tidal volume 380 PEEP of 5 FiO2 35%  . General: Comfortable at this time . Eyes: Grossly normal lids, irises & conjunctiva . ENT: grossly tongue is normal . Neck: no obvious mass . Cardiovascular: S1 S2 normal no gallop . Respiratory: No rales rhonchi noted . Abdomen: soft . Skin: no rash seen on limited exam . Musculoskeletal: not rigid . Psychiatric:unable to assess . Neurologic: no seizure no involuntary movements         Lab Data:   Basic Metabolic Panel: Recent Labs  Lab 04/22/19 0549 04/23/19 0557 04/24/19 0938 04/26/19 0748 04/28/19 0934  NA 145 145 144 141 138  K 4.7 4.3 4.2 4.0 3.8  CL 99 97* 97* 94* 90*  CO2 35* 40* 39* 37* 41*  GLUCOSE 130* 133* 130* 128* 116*  BUN 45* 42* 46* 51* 33*  CREATININE 0.50 0.51 0.54 0.66 0.33*  CALCIUM 9.0 9.0 8.9 8.7* 8.9  MG 2.2 2.3 2.3 2.3 2.1  PHOS 3.0 3.6 4.1  --   --     ABG: Recent Labs  Lab 04/22/19 0719 04/25/19 0448 04/28/19 1253  PHART 7.461* 7.458* 7.479*  PCO2ART 58.2* 60.0* 58.5*  PO2ART 62.2* 78.9* 98.0  HCO3 42.5* 41.9* 43.1*  O2SAT 95.8 95.8 98.2    Liver Function Tests: Recent Labs  Lab 04/22/19 0549 04/23/19 0557 04/24/19 0938  ALBUMIN 1.3* 1.4* 1.4*    No results for input(s): LIPASE, AMYLASE in the last 168 hours. No results for input(s): AMMONIA in the last 168 hours.  CBC: Recent Labs  Lab 04/22/19 0549 04/24/19 0938 04/26/19 0748 04/28/19 0934  WBC 19.8* 19.9* 16.5* 20.0*  HGB 8.0* 8.0* 7.9* 8.3*  HCT 27.1* 27.0* 26.6* 27.4*  MCV 96.1 95.1 94.0 92.9  PLT 319 346 331 388    Cardiac Enzymes: No results for input(s): CKTOTAL, CKMB, CKMBINDEX, TROPONINI in the last 168 hours.  BNP (last 3 results) No results for input(s): BNP in the last 8760 hours.  ProBNP (last 3 results) No results for input(s): PROBNP in the last 8760 hours.  Radiological Exams: No results found.  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 Acute on chronic respiratory with hypoxia continue with full support today for rest at current settings.  Also continue supportive measures and pulmonary toilet. 2. Cancer of the lower lobe status post resection continue to monitor 3. Severe sepsis right now hemodynamically stable 4. Lobar pneumonia treated continue to follow 5. Pneumothorax resolved   I have personally seen and evaluated the patient, evaluated laboratory and imaging results,  formulated the assessment and plan and placed orders. The Patient requires high complexity decision making for assessment and support.  Case was discussed on Rounds with the Respiratory Therapy Staff  Allyne Gee, MD [facility] Pulmonary Critical Care Medicine Sleep Medicine

## 2019-04-29 DIAGNOSIS — J9621 Acute and chronic respiratory failure with hypoxia: Secondary | ICD-10-CM | POA: Diagnosis not present

## 2019-04-29 DIAGNOSIS — C3431 Malignant neoplasm of lower lobe, right bronchus or lung: Secondary | ICD-10-CM | POA: Diagnosis not present

## 2019-04-29 DIAGNOSIS — J939 Pneumothorax, unspecified: Secondary | ICD-10-CM | POA: Diagnosis not present

## 2019-04-29 DIAGNOSIS — J181 Lobar pneumonia, unspecified organism: Secondary | ICD-10-CM | POA: Diagnosis not present

## 2019-04-29 LAB — BASIC METABOLIC PANEL
Anion gap: 11 (ref 5–15)
BUN: 36 mg/dL — ABNORMAL HIGH (ref 8–23)
CO2: 36 mmol/L — ABNORMAL HIGH (ref 22–32)
Calcium: 8.9 mg/dL (ref 8.9–10.3)
Chloride: 91 mmol/L — ABNORMAL LOW (ref 98–111)
Creatinine, Ser: 0.42 mg/dL — ABNORMAL LOW (ref 0.44–1.00)
GFR calc Af Amer: >60 mL/min (ref >60)
GFR calc non Af Amer: >60 mL/min (ref >60)
Glucose, Bld: 128 mg/dL — ABNORMAL HIGH (ref 70–99)
Potassium: 3.8 mmol/L (ref 3.5–5.1)
Sodium: 138 mmol/L (ref 135–145)

## 2019-04-29 LAB — CBC
HCT: 28.8 % — ABNORMAL LOW (ref 36.0–46.0)
Hemoglobin: 8.8 g/dL — ABNORMAL LOW (ref 12.0–15.0)
MCH: 28.1 pg (ref 26.0–34.0)
MCHC: 30.6 g/dL (ref 30.0–36.0)
MCV: 92 fL (ref 80.0–100.0)
Platelets: 437 10*3/uL — ABNORMAL HIGH (ref 150–400)
RBC: 3.13 MIL/uL — ABNORMAL LOW (ref 3.87–5.11)
RDW: 16.5 % — ABNORMAL HIGH (ref 11.5–15.5)
WBC: 22.6 10*3/uL — ABNORMAL HIGH (ref 4.0–10.5)
nRBC: 0 % (ref 0.0–0.2)

## 2019-04-29 LAB — MAGNESIUM: Magnesium: 2.1 mg/dL (ref 1.7–2.4)

## 2019-04-29 NOTE — Progress Notes (Addendum)
Pulmonary Critical Care Medicine [place]   PULMONARY CRITICAL CARE SERVICE  PROGRESS NOTE  Date of Service: 04/29/2019  Amanda Duran  [id number]:[id number]  DOB: [birth year]   DOA: 04/09/2019  Referring Physician: Merton Border, MD  HPI: Amanda Duran is a 73 y.o. female seen for follow up of Acute on Chronic Respiratory Failure.  Patient remains on full support at this time currently requiring 28% FiO2 satting well with no fever.  There is some discussion of comfort care versus hospice going on with the medical team and the family.  Medications: Reviewed on Rounds  Physical Exam:  Vitals: Pulse 86 respirations 33 BP 112/73 O2 sat 99% 99.0  Ventilator Settings later mode AC VC rate of 22 tidal volume 380 PEEP of 5 FiO2 of 28%  . General: Comfortable at this time . Eyes: Grossly normal lids, irises & conjunctiva . ENT: grossly tongue is normal . Neck: no obvious mass . Cardiovascular: S1 S2 normal no gallop . Respiratory: No rales or rhonchi noted . Abdomen: soft . Skin: no rash seen on limited exam . Musculoskeletal: not rigid . Psychiatric:unable to assess . Neurologic: no seizure no involuntary movements         Lab Data:   Basic Metabolic Panel: Recent Labs  Lab 04/23/19 0557 04/24/19 0938 04/26/19 0748 04/28/19 0934 04/29/19 0624  NA 145 144 141 138 138  K 4.3 4.2 4.0 3.8 3.8  CL 97* 97* 94* 90* 91*  CO2 40* 39* 37* 41* 36*  GLUCOSE 133* 130* 128* 116* 128*  BUN 42* 46* 51* 33* 36*  CREATININE 0.51 0.54 0.66 0.33* 0.42*  CALCIUM 9.0 8.9 8.7* 8.9 8.9  MG 2.3 2.3 2.3 2.1 2.1  PHOS 3.6 4.1  --   --   --     ABG: Recent Labs  Lab 04/25/19 0448 04/28/19 1253  PHART 7.458* 7.479*  PCO2ART 60.0* 58.5*  PO2ART 78.9* 98.0  HCO3 41.9* 43.1*  O2SAT 95.8 98.2    Liver Function Tests: Recent Labs  Lab 04/23/19 0557 04/24/19 0938  ALBUMIN 1.4* 1.4*   No results for input(s): LIPASE, AMYLASE in the last 168 hours. No results for input(s):  AMMONIA in the last 168 hours.  CBC: Recent Labs  Lab 04/24/19 0938 04/26/19 0748 04/28/19 0934 04/29/19 0624  WBC 19.9* 16.5* 20.0* 22.6*  HGB 8.0* 7.9* 8.3* 8.8*  HCT 27.0* 26.6* 27.4* 28.8*  MCV 95.1 94.0 92.9 92.0  PLT 346 331 388 437*    Cardiac Enzymes: No results for input(s): CKTOTAL, CKMB, CKMBINDEX, TROPONINI in the last 168 hours.  BNP (last 3 results) No results for input(s): BNP in the last 8760 hours.  ProBNP (last 3 results) No results for input(s): PROBNP in the last 8760 hours.  Radiological Exams: No results found.  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 Acute on chronic respiratory with hypoxia continue with full support today for rest at current settings.  Also continue supportive measures and pulmonary toilet. 2. Cancer of the lower lobe status post resection continue to monitor 3. Severe sepsis right now hemodynamically stable 4. Lobar pneumonia treated continue to follow 5. Pneumothorax resolved   I have personally seen and evaluated the patient, evaluated laboratory and imaging results, formulated the assessment and plan and placed orders. The Patient  requires high complexity decision making for assessment and support.  Case was discussed on Rounds with the Respiratory Therapy Staff  Allyne Gee, MD [facility] Pulmonary Critical Care Medicine Sleep Medicine

## 2019-04-30 DIAGNOSIS — J939 Pneumothorax, unspecified: Secondary | ICD-10-CM | POA: Diagnosis not present

## 2019-04-30 DIAGNOSIS — J181 Lobar pneumonia, unspecified organism: Secondary | ICD-10-CM | POA: Diagnosis not present

## 2019-04-30 DIAGNOSIS — J9621 Acute and chronic respiratory failure with hypoxia: Secondary | ICD-10-CM | POA: Diagnosis not present

## 2019-04-30 DIAGNOSIS — C3431 Malignant neoplasm of lower lobe, right bronchus or lung: Secondary | ICD-10-CM | POA: Diagnosis not present

## 2019-04-30 NOTE — Progress Notes (Addendum)
Pulmonary Critical Care Medicine [place]   PULMONARY CRITICAL CARE SERVICE  PROGRESS NOTE  Date of Service: 04/30/2019  LORIA LACINA  [id number]  DOB: 02/18/1946   DOA: 04/10/2019  Referring Physician: Merton Border, MD  HPI: Amanda Duran is a 73 y.o. female seen for follow up of Acute on Chronic Respiratory Failure.  Patient remains on full support at this time currently requiring 20% FiO2 satting well with no distress.  Medications: Reviewed on Rounds  Physical Exam:  Vitals: Pulse 87 respirations 25 BP 104/65 O2 78% temp 98.0  Ventilator Settings ventilator mode AC VC rate of 22 tidal volume 380 PEEP 5 FiO2 of 20%  . General: Comfortable at this time . Eyes: Grossly normal lids, irises & conjunctiva . ENT: grossly tongue is normal . Neck: no obvious mass . Cardiovascular: S1 S2 normal no gallop . Respiratory: No rales or rhonchi noted . Abdomen: soft . Skin: no rash seen on limited exam . Musculoskeletal: not rigid . Psychiatric:unable to assess . Neurologic: no seizure no involuntary movements         Lab Data:   Basic Metabolic Panel: Recent Labs  Lab 04/24/19 0938 04/26/19 0748 04/28/19 0934 04/29/19 0624  NA 144 141 138 138  K 4.2 4.0 3.8 3.8  CL 97* 94* 90* 91*  CO2 39* 37* 41* 36*  GLUCOSE 130* 128* 116* 128*  BUN 46* 51* 33* 36*  CREATININE 0.54 0.66 0.33* 0.42*  CALCIUM 8.9 8.7* 8.9 8.9  MG 2.3 2.3 2.1 2.1  PHOS 4.1  --   --   --     ABG: Recent Labs  Lab 04/25/19 0448 04/28/19 1253  PHART 7.458* 7.479*  PCO2ART 60.0* 58.5*  PO2ART 78.9* 98.0  HCO3 41.9* 43.1*  O2SAT 95.8 98.2    Liver Function Tests: Recent Labs  Lab 04/24/19 0938  ALBUMIN 1.4*   No results for input(s): LIPASE, AMYLASE in the last 168 hours. No results for input(s): AMMONIA in the last 168 hours.  CBC: Recent Labs  Lab 04/24/19 0938 04/26/19 0748 04/28/19 0934 04/29/19 0624  WBC 19.9* 16.5* 20.0* 22.6*  HGB 8.0* 7.9* 8.3* 8.8*   HCT 27.0* 26.6* 27.4* 28.8*  MCV 95.1 94.0 92.9 92.0  PLT 346 331 388 437*    Cardiac Enzymes: No results for input(s): CKTOTAL, CKMB, CKMBINDEX, TROPONINI in the last 168 hours.  BNP (last 3 results) No results for input(s): BNP in the last 8760 hours.  ProBNP (last 3 results) No results for input(s): PROBNP in the last 8760 hours.  Radiological Exams: No results found.  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 Acute on chronic respiratory with hypoxia continue with full support today .Also continue supportive measures and pulmonary toilet. 2. Cancer of the lower lobe status post resection continue to monitor 3. Severe sepsis right now hemodynamically stable 4. Lobar pneumonia treated continue to follow 5. Pneumothorax resolved   I have personally seen and evaluated the patient, evaluated laboratory and imaging results, formulated the assessment and plan and placed orders. The Patient requires high complexity decision making for assessment and support.  Case was discussed on Rounds with the Respiratory Therapy Staff  Allyne Gee, MD [facility] Pulmonary Critical Care Medicine Sleep Medicine

## 2019-05-01 DIAGNOSIS — J9621 Acute and chronic respiratory failure with hypoxia: Secondary | ICD-10-CM | POA: Diagnosis not present

## 2019-05-01 DIAGNOSIS — C3431 Malignant neoplasm of lower lobe, right bronchus or lung: Secondary | ICD-10-CM | POA: Diagnosis not present

## 2019-05-01 DIAGNOSIS — J181 Lobar pneumonia, unspecified organism: Secondary | ICD-10-CM | POA: Diagnosis not present

## 2019-05-01 DIAGNOSIS — J939 Pneumothorax, unspecified: Secondary | ICD-10-CM | POA: Diagnosis not present

## 2019-05-01 NOTE — Progress Notes (Addendum)
Pulmonary Critical Care Medicine [place]   PULMONARY CRITICAL CARE SERVICE  PROGRESS NOTE  Date of Service: 04/30/2019  MARCEE JACOBS  [id number]  DOB: 12-Jul-1946   DOA: 04/03/2019  Referring Physician: Merton Border, MD  HPI: FREDERICK KLINGER is a 73 y.o. female seen for follow up of Acute on Chronic Respiratory Failure.  Remains on 3 L of oxygen via nasal cannula was extubated yesterday and is on comfort care.  Medications: Reviewed on Rounds  Physical Exam:  Vitals: Pulse 68 respirations 20 BP 119/65 O2 sat 94% temp 97.7  Ventilator Settings 3 L nasal cannula  . General: Comfortable at this time . Eyes: Grossly normal lids, irises & conjunctiva . ENT: grossly tongue is normal . Neck: no obvious mass . Cardiovascular: S1 S2 normal no gallop . Respiratory: No rales or rhonchi noted . Abdomen: soft . Skin: no rash seen on limited exam . Musculoskeletal: not rigid . Psychiatric:unable to assess . Neurologic: no seizure no involuntary movements         Lab Data:   Basic Metabolic Panel: Recent Labs  Lab 04/26/19 0748 04/28/19 0934 04/29/19 0624  NA 141 138 138  K 4.0 3.8 3.8  CL 94* 90* 91*  CO2 37* 41* 36*  GLUCOSE 128* 116* 128*  BUN 51* 33* 36*  CREATININE 0.66 0.33* 0.42*  CALCIUM 8.7* 8.9 8.9  MG 2.3 2.1 2.1    ABG: Recent Labs  Lab 04/25/19 0448 04/28/19 1253  PHART 7.458* 7.479*  PCO2ART 60.0* 58.5*  PO2ART 78.9* 98.0  HCO3 41.9* 43.1*  O2SAT 95.8 98.2    Liver Function Tests: No results for input(s): AST, ALT, ALKPHOS, BILITOT, PROT, ALBUMIN in the last 168 hours. No results for input(s): LIPASE, AMYLASE in the last 168 hours. No results for input(s): AMMONIA in the last 168 hours.  CBC: Recent Labs  Lab 04/26/19 0748 04/28/19 0934 04/29/19 0624  WBC 16.5* 20.0* 22.6*  HGB 7.9* 8.3* 8.8*  HCT 26.6* 27.4* 28.8*  MCV 94.0 92.9 92.0  PLT 331 388 437*    Cardiac Enzymes: No results for input(s): CKTOTAL, CKMB,  CKMBINDEX, TROPONINI in the last 168 hours.  BNP (last 3 results) No results for input(s): BNP in the last 8760 hours.  ProBNP (last 3 results) No results for input(s): PROBNP in the last 8760 hours.  Radiological Exams: No results found.  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 Acute on chronic respiratory with hypoxia patient extubated and on previous nasal cannula.  On comfort care currently continue supportive measures. 2. Cancer of the lower lobe status post resection continue to monitor 3. Severe sepsis right now hemodynamically stable 4. Lobar pneumonia treated continue to follow 5. Pneumothorax resolved   I have personally seen and evaluated the patient, evaluated laboratory and imaging results, formulated the assessment and plan and placed orders. The Patient requires high complexity decision making for assessment and support.  Case was discussed on Rounds with the Respiratory Therapy Staff  Allyne Gee, MD [facility] Pulmonary Critical Care Medicine Sleep Medicine

## 2019-05-25 DEATH — deceased

## 2019-08-15 IMAGING — DX PORTABLE CHEST - 1 VIEW
1 series · 1 of 1 positions shown · non-contrast
Comparison: 04/18/2019. 04/16/2019. 03/29/2019. 03/28/2019. CT
04/14/2019.

CLINICAL DATA: Respiratory failure.

EXAM:
PORTABLE CHEST 1 VIEW

[chest ap]
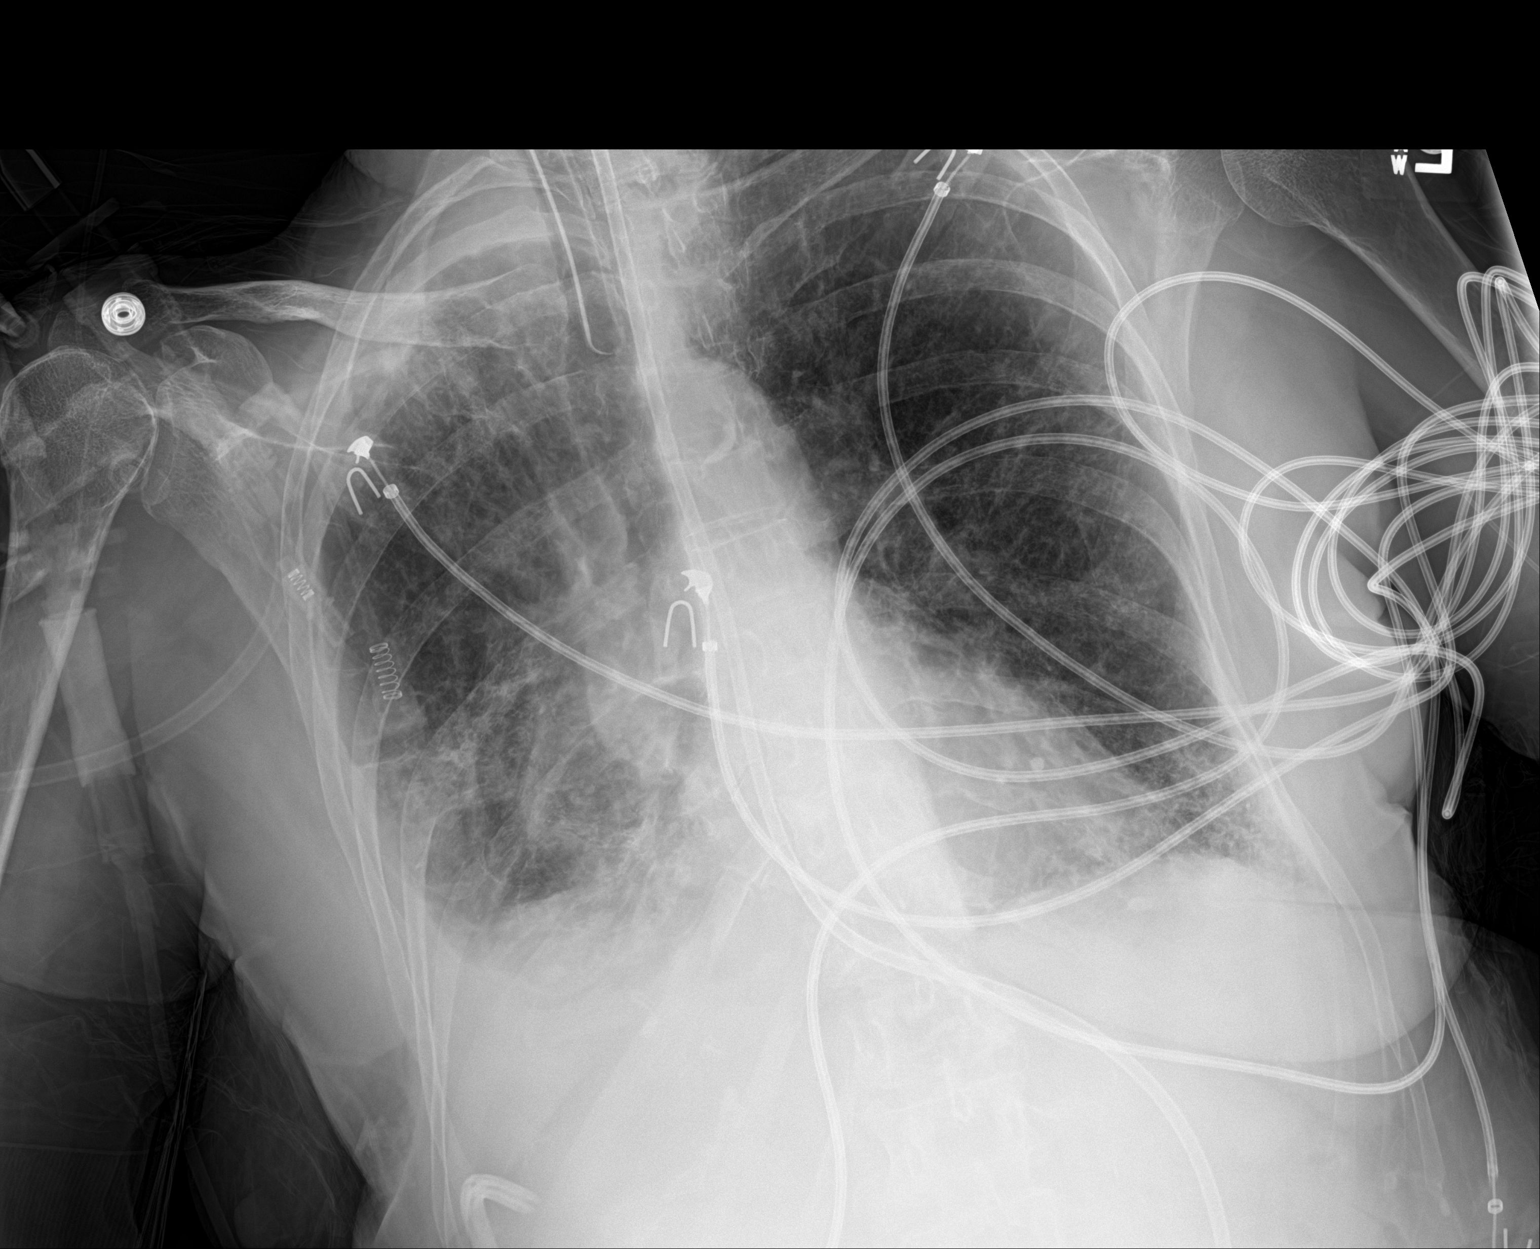

[1 of 1 positions shown; findings below may reference images not displayed]

FINDINGS: Endotracheal tube and feeding tube in stable position. Heart size
stable. Diffuse interstitial prominence again noted without interim
change. Atelectatic changes right base with right-sided pleural
thickening/effusion again noted. Right apical pleural thickening
again noted consistent scarring. Surgical drain noted over the right
base.
IMPRESSION: 1.  Lines and tubes stable position.

2. Diffuse interstitial prominence again noted without interim
change. Atelectatic changes again noted over the right base with
right-sided pleural thickening and/or effusion. Surgical drain noted
over the right base. No pneumothorax.
